# Patient Record
Sex: Female | Born: 1935 | Race: White | Hispanic: No | Smoking: Never smoker
Health system: Southern US, Community
[De-identification: ages and names within clinical notes are randomized; demographics above are authoritative.]

## PROBLEM LIST (undated history)

## (undated) DIAGNOSIS — F419 Anxiety disorder, unspecified: Secondary | ICD-10-CM

## (undated) DIAGNOSIS — E785 Hyperlipidemia, unspecified: Secondary | ICD-10-CM

## (undated) DIAGNOSIS — K625 Hemorrhage of anus and rectum: Secondary | ICD-10-CM

## (undated) DIAGNOSIS — R55 Syncope and collapse: Secondary | ICD-10-CM

## (undated) DIAGNOSIS — I350 Nonrheumatic aortic (valve) stenosis: Secondary | ICD-10-CM

## (undated) DIAGNOSIS — R011 Cardiac murmur, unspecified: Secondary | ICD-10-CM

## (undated) DIAGNOSIS — T7840XA Allergy, unspecified, initial encounter: Secondary | ICD-10-CM

## (undated) DIAGNOSIS — R0600 Dyspnea, unspecified: Secondary | ICD-10-CM

## (undated) DIAGNOSIS — I10 Essential (primary) hypertension: Secondary | ICD-10-CM

## (undated) DIAGNOSIS — R0989 Other specified symptoms and signs involving the circulatory and respiratory systems: Secondary | ICD-10-CM

## (undated) DIAGNOSIS — M1990 Unspecified osteoarthritis, unspecified site: Secondary | ICD-10-CM

## (undated) HISTORY — DX: Hyperlipidemia, unspecified: E78.5

## (undated) HISTORY — DX: Other specified symptoms and signs involving the circulatory and respiratory systems: R09.89

## (undated) HISTORY — PX: CARDIAC VALVE REPLACEMENT: SHX585

## (undated) HISTORY — DX: Unspecified osteoarthritis, unspecified site: M19.90

## (undated) HISTORY — DX: Allergy, unspecified, initial encounter: T78.40XA

## (undated) HISTORY — DX: Essential (primary) hypertension: I10

## (undated) HISTORY — DX: Syncope and collapse: R55

## (undated) HISTORY — DX: Cardiac murmur, unspecified: R01.1

---

## 1995-06-08 HISTORY — PX: BREAST BIOPSY: SHX20

## 1998-07-25 ENCOUNTER — Ambulatory Visit (HOSPITAL_COMMUNITY): Admission: RE | Admit: 1998-07-25 | Discharge: 1998-07-25 | Payer: Self-pay | Admitting: Obstetrics & Gynecology

## 2004-06-07 HISTORY — PX: COLONOSCOPY: SHX174

## 2004-10-07 ENCOUNTER — Other Ambulatory Visit: Payer: Self-pay

## 2004-10-07 ENCOUNTER — Inpatient Hospital Stay: Payer: Self-pay | Admitting: Endocrinology

## 2005-04-07 ENCOUNTER — Ambulatory Visit: Payer: Self-pay | Admitting: General Surgery

## 2005-05-17 ENCOUNTER — Ambulatory Visit: Payer: Self-pay | Admitting: General Surgery

## 2006-04-12 ENCOUNTER — Ambulatory Visit: Payer: Self-pay | Admitting: General Surgery

## 2006-04-15 ENCOUNTER — Ambulatory Visit: Payer: Self-pay | Admitting: General Surgery

## 2007-05-09 ENCOUNTER — Ambulatory Visit: Payer: Self-pay | Admitting: General Surgery

## 2008-05-09 ENCOUNTER — Ambulatory Visit: Payer: Self-pay | Admitting: General Surgery

## 2009-06-19 ENCOUNTER — Ambulatory Visit: Payer: Self-pay | Admitting: General Surgery

## 2009-12-29 ENCOUNTER — Inpatient Hospital Stay: Payer: Self-pay | Admitting: Endocrinology

## 2010-06-07 DIAGNOSIS — R55 Syncope and collapse: Secondary | ICD-10-CM

## 2010-06-07 HISTORY — DX: Syncope and collapse: R55

## 2010-06-22 ENCOUNTER — Ambulatory Visit: Payer: Self-pay | Admitting: General Surgery

## 2011-06-24 ENCOUNTER — Ambulatory Visit: Payer: Self-pay | Admitting: General Surgery

## 2011-08-25 ENCOUNTER — Emergency Department: Payer: Self-pay | Admitting: Emergency Medicine

## 2011-08-25 LAB — COMPREHENSIVE METABOLIC PANEL
Anion Gap: 6 — ABNORMAL LOW (ref 7–16)
Calcium, Total: 8.8 mg/dL (ref 8.5–10.1)
Chloride: 98 mmol/L (ref 98–107)
EGFR (African American): >60
Glucose: 110 mg/dL — ABNORMAL HIGH (ref 65–99)
Osmolality: 263 (ref 275–301)
Potassium: 3.2 mmol/L — ABNORMAL LOW (ref 3.5–5.1)
SGOT(AST): 15 U/L (ref 15–37)
Total Protein: 7.6 g/dL (ref 6.4–8.2)

## 2011-08-25 LAB — URINALYSIS, COMPLETE
Bacteria: NONE SEEN
Glucose,UR: NEGATIVE mg/dL (ref 0–75)
Protein: NEGATIVE
RBC,UR: 1 /HPF (ref 0–5)
WBC UR: 3 /HPF (ref 0–5)

## 2011-08-25 LAB — CBC
MCH: 31.8 pg (ref 26.0–34.0)
MCHC: 34.1 g/dL (ref 32.0–36.0)
Platelet: 232 10*3/uL (ref 150–440)
RDW: 12.7 % (ref 11.5–14.5)
WBC: 4.7 10*3/uL (ref 3.6–11.0)

## 2011-09-02 ENCOUNTER — Ambulatory Visit: Payer: Self-pay

## 2011-09-02 LAB — URINALYSIS, COMPLETE
Bacteria: NEGATIVE
Bilirubin,UR: NEGATIVE
Blood: NEGATIVE
Glucose,UR: NEGATIVE mg/dL (ref 0–75)
Ketone: NEGATIVE
Leukocyte Esterase: NEGATIVE
Nitrite: NEGATIVE
Ph: 6.5 (ref 4.5–8.0)
Protein: NEGATIVE
RBC,UR: NONE SEEN /HPF (ref 0–5)

## 2011-09-03 ENCOUNTER — Ambulatory Visit: Payer: Self-pay | Admitting: Internal Medicine

## 2012-06-07 HISTORY — PX: CATARACT EXTRACTION: SUR2

## 2012-06-26 ENCOUNTER — Ambulatory Visit: Payer: Self-pay | Admitting: General Surgery

## 2012-07-24 ENCOUNTER — Ambulatory Visit: Payer: Self-pay | Admitting: Ophthalmology

## 2012-07-31 ENCOUNTER — Ambulatory Visit: Payer: Self-pay | Admitting: Ophthalmology

## 2012-12-05 ENCOUNTER — Encounter: Payer: Self-pay | Admitting: *Deleted

## 2012-12-29 ENCOUNTER — Encounter: Payer: Self-pay | Admitting: General Surgery

## 2012-12-29 ENCOUNTER — Telehealth: Payer: Self-pay | Admitting: General Surgery

## 2012-12-29 NOTE — Telephone Encounter (Signed)
PATIENT CALLED TODAY TO SCHEDULE CAROTID U/S AT [facility].SHE WAS LAST SEEN IN 07-03-12. HAD BUT OFF DUE TO CATARACT SURGERY IN February 2014. CAROLYN SCHEDULED IT FOR 01-04-13@ 2:00. WOULD YOU LIKE TO SEE HER AFTERWARDS?

## 2013-01-04 ENCOUNTER — Ambulatory Visit: Payer: Self-pay | Admitting: General Surgery

## 2013-01-05 ENCOUNTER — Encounter: Payer: Self-pay | Admitting: General Surgery

## 2013-04-24 ENCOUNTER — Ambulatory Visit: Payer: Self-pay | Admitting: Ophthalmology

## 2013-06-27 ENCOUNTER — Ambulatory Visit: Payer: Self-pay | Admitting: General Surgery

## 2013-06-28 ENCOUNTER — Encounter: Payer: Self-pay | Admitting: General Surgery

## 2013-07-04 ENCOUNTER — Ambulatory Visit (INDEPENDENT_AMBULATORY_CARE_PROVIDER_SITE_OTHER): Payer: Medicare Other | Admitting: General Surgery

## 2013-07-04 ENCOUNTER — Encounter: Payer: Self-pay | Admitting: General Surgery

## 2013-07-04 VITALS — BP 124/82 | HR 64 | Resp 12 | Ht 65.0 in | Wt 137.0 lb

## 2013-07-04 DIAGNOSIS — Z1239 Encounter for other screening for malignant neoplasm of breast: Secondary | ICD-10-CM | POA: Insufficient documentation

## 2013-07-04 NOTE — Patient Instructions (Signed)
Patient to return in 1 year with bilateral screening mammogram. Continue self breast checks. Patient to call with any questions or concerns.

## 2013-07-04 NOTE — Progress Notes (Signed)
Patient ID: Sheila Osborne, female   DOB: 1935/12/16, 78 y.o.   MRN: [id number]  Chief Complaint  Patient presents with  . Follow-up    mammogram    HPI Sheila Osborne is a 78 y.o. female who presents for her follow up breast evaluation. The most recent mammogram was done on 06/27/13. Patient does perform regular self breast checks and gets regular mammograms done. The patient denies any new problems with the breasts at this time.   HPI  Past Medical History  Diagnosis Date  . Hypertension   . Syncope 2012  . Carotid bruit     Past Surgical History  Procedure Laterality Date  . Colonoscopy  2006  . Breast biopsy Left 1997  . Cataract extraction  2014    x2    Family History  Problem Relation Age of Onset  . Cancer Father     stomach    Social History History  Substance Use Topics  . Smoking status: Never Smoker   . Smokeless tobacco: Never Used  . Alcohol Use: Yes     Comment: 1/day    No Known Allergies  Current Outpatient Prescriptions  Medication Sig Dispense Refill  . alendronate (FOSAMAX) 70 MG tablet Take 70 mg by mouth once a week.       Marland Kitchen amLODipine (NORVASC) 5 MG tablet Take 5 mg by mouth daily.      Marland Kitchen aspirin 325 MG tablet Take 325 mg by mouth daily.      Marland Kitchen BENICAR 40 MG tablet Take 40 mg by mouth daily.       . clorazepate (TRANXENE) 3.75 MG tablet Take 3.75 mg by mouth 2 (two) times daily as needed for anxiety.      . Ergocalciferol (VITAMIN D2 PO) Take 1.25 mg by mouth once a week.      . vitamin C (ASCORBIC ACID) 500 MG tablet Take 500 mg by mouth daily.       No current facility-administered medications for this visit.    Review of Systems Review of Systems  Constitutional: Negative.   Respiratory: Negative.   Cardiovascular: Negative.     Blood pressure 124/82, pulse 64, resp. rate 12, height 5\' 5"  (1.651 m), weight 137 lb (62.143 kg).  Physical Exam Physical Exam  Constitutional: She is oriented to person, place, and time. She  appears well-developed and well-nourished.  Neck: Neck supple. No thyromegaly present.  Cardiovascular: Normal rate and regular rhythm.   Murmur heard.  Systolic murmur is present with a grade of 3/6  Pulmonary/Chest: Effort normal and breath sounds normal. Right breast exhibits no inverted nipple, no mass, no nipple discharge, no skin change and no tenderness. Left breast exhibits no inverted nipple, no mass, no nipple discharge, no skin change and no tenderness.  Lymphadenopathy:    She has no cervical adenopathy.    She has no axillary adenopathy.  Neurological: She is alert and oriented to person, place, and time.  Skin: Skin is warm and dry.    Data Reviewed Bilateral screening mammograms dated June 27, 2013 were reviewed and compared to previous studies. No interval change. BI-RAD-1.  Assessment    Benign breast exam.     Plan    The patient desires to continue annual screenings answers office. We'll arrange for a follow up examination in one year.        Robert Bellow 07/05/2013, 6:50 PM

## 2014-03-22 ENCOUNTER — Other Ambulatory Visit: Payer: Self-pay

## 2014-04-08 ENCOUNTER — Encounter: Payer: Self-pay | Admitting: General Surgery

## 2014-07-02 ENCOUNTER — Ambulatory Visit: Payer: Self-pay | Admitting: General Surgery

## 2014-07-03 ENCOUNTER — Encounter: Payer: Self-pay | Admitting: General Surgery

## 2014-07-04 ENCOUNTER — Ambulatory Visit (INDEPENDENT_AMBULATORY_CARE_PROVIDER_SITE_OTHER): Payer: Medicare Other | Admitting: General Surgery

## 2014-07-04 ENCOUNTER — Encounter: Payer: Self-pay | Admitting: General Surgery

## 2014-07-04 VITALS — BP 128/80 | HR 88 | Resp 14 | Ht 65.0 in | Wt 137.0 lb

## 2014-07-04 DIAGNOSIS — Z1239 Encounter for other screening for malignant neoplasm of breast: Secondary | ICD-10-CM

## 2014-07-04 NOTE — Progress Notes (Signed)
Patient ID: Sheila Osborne, female   DOB: 09-Oct-1935, 79 y.o.   MRN: [id number]  Chief Complaint  Patient presents with  . Follow-up    mammogram     HPI Sheila Osborne is a 79 y.o. female who presents for a breast evaluation. The most recent mammogram was done on 07/02/14. Patient does perform regular self breast checks and gets regular mammograms done. The patient denies any new problems with the breasts at this time.   The patient reports that when she walks uphill from the [facility] to her home she does develop a little shortness of breath that might be a little more pronounced than years past. She denied any dramatic change. This may in part be attributed to her decreased general activity level and winter, not doing is much aerobic exercise as she doesn't summer.   HPI  Past Medical History  Diagnosis Date  . Hypertension   . Syncope 2012  . Carotid bruit     Past Surgical History  Procedure Laterality Date  . Colonoscopy  2006  . Breast biopsy Left 1997  . Cataract extraction  2014    x2    Family History  Problem Relation Age of Onset  . Cancer Father     stomach    Social History History  Substance Use Topics  . Smoking status: Never Smoker   . Smokeless tobacco: Never Used  . Alcohol Use: 0.0 oz/week    0 Not specified per week     Comment: 1/day    No Known Allergies  Current Outpatient Prescriptions  Medication Sig Dispense Refill  . alendronate (FOSAMAX) 70 MG tablet Take 70 mg by mouth once a week.     Marland Kitchen amLODipine (NORVASC) 5 MG tablet Take 5 mg by mouth daily.    Marland Kitchen aspirin 325 MG tablet Take 325 mg by mouth daily.    Marland Kitchen BENICAR 40 MG tablet Take 40 mg by mouth daily.     . clorazepate (TRANXENE) 3.75 MG tablet Take 3.75 mg by mouth 2 (two) times daily as needed for anxiety.    . Ergocalciferol (VITAMIN D2 PO) Take 1.25 mg by mouth once a week.    . vitamin C (ASCORBIC ACID) 500 MG tablet Take 500 mg by mouth daily.     No current  facility-administered medications for this visit.    Review of Systems Review of Systems  Constitutional: Negative.   Respiratory: Negative.   Cardiovascular: Negative.     Blood pressure 128/80, pulse 88, resp. rate 14, height 5\' 5"  (1.651 m), weight 137 lb (62.143 kg).  Physical Exam Physical Exam  Constitutional: She is oriented to person, place, and time. She appears well-developed and well-nourished.  Neck: Neck supple.  Cardiovascular: Normal rate and regular rhythm.   Murmur heard.  Systolic murmur is present with a grade of 2/6  Pulmonary/Chest: Effort normal and breath sounds normal. Right breast exhibits no inverted nipple, no mass, no nipple discharge, no skin change and no tenderness. Left breast exhibits no inverted nipple, no mass, no nipple discharge, no skin change and no tenderness.  Thickening upper outer quadrant bilateral breast.  Lymphadenopathy:    She has no cervical adenopathy.    She has no axillary adenopathy.  Neurological: She is alert and oriented to person, place, and time.  Skin: Skin is warm and dry.    Data Reviewed Screening mammograms completed with 3-D tomo synthesis by radiology at [facility] on 07/02/2014 showed no interval change. BI-RADS-1.  Films were compared back to 2014. No interval change. A significant volume of breast parenchyma remains in the upper-outer quadrant bilaterally.  Assessment    Benign breast exam.  10 year follow-up colonoscopy due in fall 2016.  Minimal change in exercise tolerance.    Plan    The patient had questions about the advisability of ongoing screening mammography considering her "age". While 78, her activity level is that of someone in their 52s. In spite of the new screening recommendations, and in light of her dense breast that would continue annual screening mammograms at this time.    Patient will be asked to return to the office in one year with a bilateral screening mammogram. Discuss colonoscopy  next year.  Continue self breast exams. Call office for any new breast issues or concerns.  PCP:  Teodoro Kil 07/04/2014, 12:14 PM

## 2014-07-04 NOTE — Patient Instructions (Addendum)
Patient will be asked to return to the office in one year with a bilateral screening mammogram. Continue self breast exams. Call office for any new breast issues or concerns. Colonoscopy was 2006 discuss colonoscopy at next visit.

## 2014-09-27 NOTE — Op Note (Signed)
PATIENT NAME:  Sheila Osborne, Sheila Osborne MR#:  [id number] DATE OF BIRTH:  04-02-1936  DATE OF PROCEDURE:  04/24/2013  LOCATION: [facility].   PREOPERATIVE DIAGNOSIS: Visually significant cataract of the right eye.   POSTOPERATIVE DIAGNOSIS: Visually significant cataract of the right eye.   OPERATIVE PROCEDURE: Cataract extraction by phacoemulsification with implant of intraocular lens to right eye.   SURGEON: Birder Robson, MD.   ANESTHESIA:  1. Managed anesthesia care.  2. Topical tetracaine drops followed by 2% Xylocaine jelly applied in the preoperative holding area.   COMPLICATIONS: None.   TECHNIQUE:  Stop and chop.  DESCRIPTION OF PROCEDURE: The patient was examined and consented in the preoperative holding area where the aforementioned topical anesthesia was applied to the right eye and then brought back to the Operating Room where the right eye was prepped and draped in the usual sterile ophthalmic fashion and a lid speculum was placed. A paracentesis was created with the side port blade and the anterior chamber was filled with viscoelastic. A near clear corneal incision was performed with the steel keratome. A continuous curvilinear capsulorrhexis was performed with a cystotome followed by the capsulorrhexis forceps. Hydrodissection and hydrodelineation were carried out with BSS on a blunt cannula. The lens was removed in a stop and chop technique and the remaining cortical material was removed with the irrigation-aspiration handpiece. The capsular bag was inflated with viscoelastic and the Tecnis ZCB00 20.0 -diopter lens, serial number [id number] was placed in the capsular bag without complication. The remaining viscoelastic was removed from the eye with the irrigation-aspiration handpiece. The wounds were hydrated. The anterior chamber was flushed with Miostat and the eye was inflated to physiologic pressure. 0.1 mL of cefuroxime concentration 10 mg/mL was placed in  the anterior chamber. The wounds were found to be water tight. The eye was dressed with Vigamox. The patient was given protective glasses to wear throughout the day and a shield with which to sleep tonight. The patient was also given drops with which to begin a drop regimen today and will follow-up with me in one day.      ____________________________ Livingston Diones. Takeyah Wieman, MD wlp:cc D: 04/24/2013 20:38:57 ET T: 04/24/2013 21:25:59 ET JOB#: [id number]  cc: Raylynne Cubbage L. Cagney Steenson, MD, <Dictator> Livingston Diones Chayna Surratt MD ELECTRONICALLY SIGNED 04/25/2013 13:28

## 2014-09-27 NOTE — Op Note (Signed)
PATIENT NAME:  Sheila Osborne, Sheila Osborne MR#:  [id number] DATE OF BIRTH:  07-30-1935  DATE OF PROCEDURE:  07/31/2012  PREOPERATIVE DIAGNOSIS: Visually significant cataract of the left eye.   POSTOPERATIVE DIAGNOSIS: Visually significant cataract of the left eye.   OPERATIVE PROCEDURE: Cataract extraction by phacoemulsification with implant of intraocular lens to left eye.   SURGEON: Birder Robson, MD.   ANESTHESIA:  1. Managed anesthesia care.  2. Topical tetracaine drops followed by 2% Xylocaine jelly applied in the preoperative holding area.   COMPLICATIONS: None.   TECHNIQUE:  Stop and chop.  DESCRIPTION OF PROCEDURE: The patient was examined and consented in the preoperative holding area where the aforementioned topical anesthesia was applied to the left eye and then brought back to the Operating Room where the left eye was prepped and draped in the usual sterile ophthalmic fashion and a lid speculum was placed. A paracentesis was created with the side port blade and the anterior chamber was filled with viscoelastic. A near clear corneal incision was performed with the steel keratome. A continuous curvilinear capsulorrhexis was performed with a cystotome followed by the capsulorrhexis forceps. Hydrodissection and hydrodelineation were carried out with BSS on a blunt cannula. The lens was removed in a stop and chop technique and the remaining cortical material was removed with the irrigation-aspiration handpiece. The capsular bag was inflated with viscoelastic and the Tecnis ZCB00 19.0-diopter lens, serial number [id number] was placed in the capsular bag without complication. The remaining viscoelastic was removed from the eye with the irrigation-aspiration handpiece. The wounds were hydrated. The anterior chamber was flushed with Miostat and the eye was inflated to physiologic pressure. 0.1 mL of cefuroxime concentration 10 mg/mL was placed in the anterior chamber. The wounds were found to be water  tight. The eye was dressed with Vigamox. The patient was given protective glasses to wear throughout the day and a shield with which to sleep tonight. The patient was also given drops with which to begin a drop regimen today and will follow-up with me in one day.  ____________________________ Livingston Diones. Krayton Wortley, MD wlp:sb D: 07/31/2012 11:42:22 ET T: 07/31/2012 13:04:12 ET JOB#: [id number]  cc: Wynter Isaacs L. Britanee Vanblarcom, MD, <Dictator> Livingston Diones Latesha Chesney MD ELECTRONICALLY SIGNED 08/18/2012 17:09

## 2014-12-02 ENCOUNTER — Other Ambulatory Visit: Payer: Self-pay

## 2015-04-15 ENCOUNTER — Other Ambulatory Visit: Payer: Self-pay | Admitting: Endocrinology

## 2015-04-15 DIAGNOSIS — I471 Supraventricular tachycardia: Secondary | ICD-10-CM

## 2015-04-21 ENCOUNTER — Ambulatory Visit
Admission: RE | Admit: 2015-04-21 | Discharge: 2015-04-21 | Disposition: A | Discharge status: Home / Self Care | Payer: Medicare Other | Source: Ambulatory Visit | Attending: Endocrinology | Admitting: Endocrinology

## 2015-04-21 DIAGNOSIS — I471 Supraventricular tachycardia: Secondary | ICD-10-CM | POA: Insufficient documentation

## 2015-04-21 DIAGNOSIS — I351 Nonrheumatic aortic (valve) insufficiency: Secondary | ICD-10-CM | POA: Insufficient documentation

## 2015-04-21 DIAGNOSIS — I340 Nonrheumatic mitral (valve) insufficiency: Secondary | ICD-10-CM | POA: Diagnosis not present

## 2015-04-21 NOTE — Progress Notes (Signed)
*  PRELIMINARY RESULTS* Echocardiogram 2D Echocardiogram has been performed.  Sheila Osborne 04/21/2015, 10:38 AM

## 2015-04-24 ENCOUNTER — Other Ambulatory Visit: Payer: Self-pay | Admitting: *Deleted

## 2015-04-24 DIAGNOSIS — Z1231 Encounter for screening mammogram for malignant neoplasm of breast: Secondary | ICD-10-CM

## 2015-07-04 ENCOUNTER — Other Ambulatory Visit: Payer: Self-pay | Admitting: General Surgery

## 2015-07-04 ENCOUNTER — Ambulatory Visit
Admission: RE | Admit: 2015-07-04 | Discharge: 2015-07-04 | Disposition: A | Discharge status: Home / Self Care | Payer: Medicare Other | Source: Ambulatory Visit | Attending: General Surgery | Admitting: General Surgery

## 2015-07-04 DIAGNOSIS — Z1231 Encounter for screening mammogram for malignant neoplasm of breast: Secondary | ICD-10-CM

## 2015-07-10 ENCOUNTER — Ambulatory Visit (INDEPENDENT_AMBULATORY_CARE_PROVIDER_SITE_OTHER): Payer: Medicare Other | Admitting: General Surgery

## 2015-07-10 ENCOUNTER — Encounter: Payer: Self-pay | Admitting: General Surgery

## 2015-07-10 VITALS — BP 124/66 | HR 76 | Resp 12 | Ht 64.0 in | Wt 137.0 lb

## 2015-07-10 DIAGNOSIS — R55 Syncope and collapse: Secondary | ICD-10-CM | POA: Diagnosis not present

## 2015-07-10 DIAGNOSIS — Z1211 Encounter for screening for malignant neoplasm of colon: Secondary | ICD-10-CM

## 2015-07-10 DIAGNOSIS — I350 Nonrheumatic aortic (valve) stenosis: Secondary | ICD-10-CM | POA: Diagnosis not present

## 2015-07-10 DIAGNOSIS — Z1239 Encounter for other screening for malignant neoplasm of breast: Secondary | ICD-10-CM

## 2015-07-10 NOTE — Progress Notes (Signed)
Patient ID: Sheila Osborne, female   DOB: 07/15/1935, 80 y.o.   MRN: [id number]  Chief Complaint  Patient presents with  . Follow-up    mammogram and screening colonoscopy    HPI Sheila Osborne is a 80 y.o. female who presents for her annual breast evaluation. The most recent mammogram was done on 07/04/15 area the patient is unaware of any changes in her breasts.  Patient does perform regular self breast checks and gets regular mammograms done.  Patient also want to discuss colonoscopy. Last colonoscopy 2006. No GI problems.   Patient states she pass out while hiking in the [facility] in November 2016. She has been evaluated by her primary care physician, Dr. Francoise Schaumann and told that she had some progression of her previously identified aortic stenosis. By reports she's had an echocardiogram. She has had spells where she will become very weak, although shortness of breath is not a strong component. She has had no further episodes where she passed out. She has had a cancel social engagements because of decreased exercise tolerance.  The patient had previously been evaluated by Bartholome Bill, M.D. from cardiology in 2011 when she was admitted to [facility] with a syncopal spell. Review of the cardiology records from that visit showed that she had had several episodes of syncope over the preceding 10 years. The patient reports the episodes have become more frequent.  The patient's husband died of aortic valvular disease, possibly during surgical repair and she was quite adamant that she would never consider open heart surgery. HPI  Past Medical History  Diagnosis Date  . Hypertension   . Syncope 2012  . Carotid bruit     Past Surgical History  Procedure Laterality Date  . Colonoscopy  2006  . Cataract extraction  2014    x2  . Breast biopsy Left 1997    core- neg    Family History  Problem Relation Age of Onset  . Cancer Father     stomach  . Breast cancer  Neg Hx     Social History Social History  Substance Use Topics  . Smoking status: Never Smoker   . Smokeless tobacco: Never Used  . Alcohol Use: 0.0 oz/week    0 Standard drinks or equivalent per week     Comment: 1/day    No Known Allergies  Current Outpatient Prescriptions  Medication Sig Dispense Refill  . alendronate (FOSAMAX) 70 MG tablet Take 70 mg by mouth once a week.     Marland Kitchen amLODipine (NORVASC) 5 MG tablet Take 5 mg by mouth daily.    Marland Kitchen aspirin 325 MG tablet Take 325 mg by mouth daily.    Marland Kitchen BENICAR 40 MG tablet Take 40 mg by mouth daily.     . clorazepate (TRANXENE) 3.75 MG tablet Take 3.75 mg by mouth 2 (two) times daily as needed for anxiety.    . Ergocalciferol (VITAMIN D2 PO) Take 1.25 mg by mouth once a week.    . vitamin C (ASCORBIC ACID) 500 MG tablet Take 500 mg by mouth daily.     No current facility-administered medications for this visit.    Review of Systems Review of Systems  Constitutional: Negative.   Respiratory: Positive for shortness of breath.   Cardiovascular: Negative.     Blood pressure 124/66, pulse 76, resp. rate 12, height 5\' 4"  (1.626 m), weight 137 lb (62.143 kg).  Physical Exam Physical Exam  Constitutional: She is oriented to person, place, and time.  She appears well-developed and well-nourished.  Eyes: Conjunctivae are normal. No scleral icterus.  Neck: Neck supple.  Cardiovascular: Normal rate and regular rhythm.   Murmur heard.  Systolic murmur is present with a grade of 4/6  Pulses:      Carotid pulses are 2+ on the right side with bruit, and 2+ on the left side with bruit.   Profound bruit over the aortic outflow tract extending into the subclavian vessels bilaterally as well as into both carotids.  Pulmonary/Chest: Effort normal and breath sounds normal. Right breast exhibits no inverted nipple, no mass, no nipple discharge, no skin change and no tenderness. Left breast exhibits no inverted nipple, no mass, no nipple  discharge, no skin change and no tenderness.  Abdominal: Soft. Bowel sounds are normal. There is no tenderness.  Lymphadenopathy:    She has no cervical adenopathy.    She has no axillary adenopathy.  Neurological: She is alert and oriented to person, place, and time.  Skin: Skin is warm.    Data Reviewed Bilateral screening mammograms dated 07/04/2015 were reviewed. Prominent breast tissue for age but otherwise unremarkable. BI-RADS-1. 2011 hospitalization records.    Assessment    Benign breast exam.  Accelerating frequency of syncopal/acute fatigue possibly related to aortic valvular disease.     Plan    As the patient had previously been evaluated by Dr. Ubaldo Glassing, and with the fact that she rarely is one to complain, I thought it appropriate that she undergo repeat cardiac evaluation. She was amenable to having reassessment completed by Dr. Ubaldo Glassing.      Patient has been scheduled for an appointment to see Dr. Ubaldo Glassing at [facility] for Monday, 07-14-15 at 10 am (arrive 9:45 am) for symptomatic aortic stenosis.   Once the patient's cardiac status is clear, she will be a candidate for a screening colonoscopy.  Colonoscopy with possible biopsy/polypectomy prn: Information regarding the procedure, including its potential risks and complications (including but not limited to perforation of the bowel, which may require emergency surgery to repair, and bleeding) was verbally given to the patient. Educational information regarding lower intestinal endoscopy was given to the patient. Written instructions for how to complete the bowel prep using Miralax were provided. The importance of drinking ample fluids to avoid dehydration as a result of the prep emphasized.  Patient will be contacted to schedule a colonoscopy after upcoming appointment with Dr. Ubaldo Glassing. This patient was provided with colonoscopy instructions. Miralax prescription will be sent in once date arranged. This patient will need to  decrease 325 mg aspirin to 81 mg aspirin one week prior to colonoscopy. Patient will not require a pre-op visit prior to colonoscopy. History and physical will be updated the morning of procedure.   Patient will be asked to return to the office in one year with a bilateral screening mammogram.  PCP:  [place] This information has been scribed by Gaspar Cola CMA.    Robert Bellow 07/11/2015, 7:58 AM

## 2015-07-10 NOTE — Patient Instructions (Addendum)
Colonoscopy A colonoscopy is an exam to look at the entire large intestine (colon). This exam can help find problems such as tumors, polyps, inflammation, and areas of bleeding. The exam takes about 1 hour.  LET [facility] CARE PROVIDER KNOW ABOUT:   Any allergies you have.  All medicines you are taking, including vitamins, herbs, eye drops, creams, and over-the-counter medicines.  Previous problems you or members of your family have had with the use of anesthetics.  Any blood disorders you have.  Previous surgeries you have had.  Medical conditions you have. RISKS AND COMPLICATIONS  Generally, this is a safe procedure. However, as with any procedure, complications can occur. Possible complications include:  Bleeding.  Tearing or rupture of the colon wall.  Reaction to medicines given during the exam.  Infection (rare). BEFORE THE PROCEDURE   Ask your health care provider about changing or stopping your regular medicines.  You may be prescribed an oral bowel prep. This involves drinking a large amount of medicated liquid, starting the day before your procedure. The liquid will cause you to have multiple loose stools until your stool is almost clear or light green. This cleans out your colon in preparation for the procedure.  Do not eat or drink anything else once you have started the bowel prep, unless your health care provider tells you it is safe to do so.  Arrange for someone to drive you home after the procedure. PROCEDURE   You will be given medicine to help you relax (sedative).  You will lie on your side with your knees bent.  A long, flexible tube with a light and camera on the end (colonoscope) will be inserted through the rectum and into the colon. The camera sends video back to a computer screen as it moves through the colon. The colonoscope also releases carbon dioxide gas to inflate the colon. This helps your health care provider see the area better.  During  the exam, your health care provider may take a small tissue sample (biopsy) to be examined under a microscope if any abnormalities are found.  The exam is finished when the entire colon has been viewed. AFTER THE PROCEDURE   Do not drive for 24 hours after the exam.  You may have a small amount of blood in your stool.  You may pass moderate amounts of gas and have mild abdominal cramping or bloating. This is caused by the gas used to inflate your colon during the exam.  Ask when your test results will be ready and how you will get your results. Make sure you get your test results.   This information is not intended to replace advice given to you by your health care provider. Make sure you discuss any questions you have with your health care provider.   Document Released: 05/21/2000 Document Revised: 03/14/2013 Document Reviewed: 01/29/2013 Elsevier Interactive Patient Education [employer].  Patient has been scheduled for an appointment to see Dr. Ubaldo Glassing at [facility] for Monday, 07-14-15 at 10 am (arrive 9:45 am).   Patient will be asked to return to the office in one year with a bilateral screening mammogram.

## 2015-07-11 DIAGNOSIS — R55 Syncope and collapse: Secondary | ICD-10-CM | POA: Insufficient documentation

## 2015-07-11 DIAGNOSIS — Z1211 Encounter for screening for malignant neoplasm of colon: Secondary | ICD-10-CM | POA: Insufficient documentation

## 2015-07-11 DIAGNOSIS — I350 Nonrheumatic aortic (valve) stenosis: Secondary | ICD-10-CM | POA: Insufficient documentation

## 2015-12-18 ENCOUNTER — Telehealth: Payer: Self-pay | Admitting: *Deleted

## 2015-12-18 NOTE — Telephone Encounter (Signed)
Patient contacted today to schedule colonoscopy for September 2017.   The patient states that she wants to wait until November 2017 to have this completed. Patient will be contacted once schedule is available to arrange a date.   Patient has been asked to decrease current 325 mg aspirin to 81 mg aspirin starting one week prior to procedure. Miralax prescription will be sent in to patient's pharmacy once date arranged.

## 2015-12-20 ENCOUNTER — Encounter: Payer: Self-pay | Admitting: Emergency Medicine

## 2015-12-20 ENCOUNTER — Emergency Department
Admission: EM | Admit: 2015-12-20 | Discharge: 2015-12-20 | Disposition: A | Discharge status: Home / Self Care | Payer: Medicare Other | Attending: Emergency Medicine | Admitting: Emergency Medicine

## 2015-12-20 DIAGNOSIS — H579 Unspecified disorder of eye and adnexa: Secondary | ICD-10-CM | POA: Diagnosis present

## 2015-12-20 DIAGNOSIS — H029 Unspecified disorder of eyelid: Secondary | ICD-10-CM | POA: Insufficient documentation

## 2015-12-20 DIAGNOSIS — Z7982 Long term (current) use of aspirin: Secondary | ICD-10-CM | POA: Diagnosis not present

## 2015-12-20 DIAGNOSIS — Z79899 Other long term (current) drug therapy: Secondary | ICD-10-CM | POA: Insufficient documentation

## 2015-12-20 DIAGNOSIS — I10 Essential (primary) hypertension: Secondary | ICD-10-CM | POA: Insufficient documentation

## 2015-12-20 MED ORDER — KETOROLAC TROMETHAMINE 0.5 % OP SOLN: 1.0000 [drp] | Freq: Four times a day (QID) | OPHTHALMIC | Status: DC

## 2015-12-20 NOTE — ED Provider Notes (Signed)
[facility] Emergency Department Provider Note  ____________________________________________  Time seen: Approximately 5:17 PM  I have reviewed the triage vital signs and the nursing notes.   HISTORY  Chief Complaint Eye Problem    HPI Sheila Osborne is a 80 y.o. female who presents to emergency department complaining of a small "blister" to the medial aspect of the upper eyelid over the right eye. Patient states that she first noted a slight itching/irritation feeling in her eye. She looked at her eye and the mere and believed that she saw a blister on the eyelid. The patient states that she moved earlier today but states that she wore glasses and does not remember anything coming into contact with her eye. She denies any pain, visual changes, discharge from eye. She denies any tenderness to palpation with rubbing her eyelid.Patient denies any other complaints. Patient does wear eye glasses but does not wear contacts.   Past Medical History  Diagnosis Date  . Hypertension   . Syncope 2012  . Carotid bruit     Patient Active Problem List   Diagnosis Date Noted  . Encounter for screening colonoscopy 07/11/2015  . Faintness 07/11/2015  . Aortic stenosis 07/11/2015  . Breast screening 07/04/2013    Past Surgical History  Procedure Laterality Date  . Colonoscopy  2006  . Cataract extraction  2014    x2  . Breast biopsy Left 1997    core- neg    Current Outpatient Rx  Name  Route  Sig  Dispense  Refill  . alendronate (FOSAMAX) 70 MG tablet   Oral   Take 70 mg by mouth once a week.          Marland Kitchen amLODipine (NORVASC) 5 MG tablet   Oral   Take 5 mg by mouth daily.         Marland Kitchen aspirin 325 MG tablet   Oral   Take 325 mg by mouth daily.         Marland Kitchen BENICAR 40 MG tablet   Oral   Take 40 mg by mouth daily.          . clorazepate (TRANXENE) 3.75 MG tablet   Oral   Take 3.75 mg by mouth 2 (two) times daily as needed for anxiety.          . Ergocalciferol (VITAMIN D2 PO)   Oral   Take 1.25 mg by mouth once a week.         Marland Kitchen ketorolac (ACULAR) 0.5 % ophthalmic solution   Right Eye   Place 1 drop into the right eye 4 (four) times daily.   5 mL   0   . vitamin C (ASCORBIC ACID) 500 MG tablet   Oral   Take 500 mg by mouth daily.           Allergies Review of patient's allergies indicates no known allergies.  Family History  Problem Relation Age of Onset  . Cancer Father     stomach  . Breast cancer Neg Hx     Social History Social History  Substance Use Topics  . Smoking status: Never Smoker   . Smokeless tobacco: Never Used  . Alcohol Use: 0.0 oz/week    0 Standard drinks or equivalent per week     Comment: 1/day     Review of Systems  Constitutional: No fever/chills Eyes: No visual changes. No discharge ENT: No upper respiratory complaints. Cardiovascular: no chest pain. Respiratory: no cough. No SOB.  Skin: Negative for rash, abrasions, lacerations, ecchymosis. Neurological: Negative for headaches, focal weakness or numbness. 10-point ROS otherwise negative.  ____________________________________________   PHYSICAL EXAM:  VITAL SIGNS: ED Triage Vitals  Enc Vitals Group     BP 12/20/15 1548 145/85 mmHg     Pulse Rate 12/20/15 1548 86     Resp 12/20/15 1548 20     Temp 12/20/15 1548 98.7 F (37.1 C)     Temp Source 12/20/15 1548 Oral     SpO2 12/20/15 1548 99 %     Weight 12/20/15 1548 145 lb (65.772 kg)     Height 12/20/15 1548 5\' 4"  (1.626 m)     Head Cir --      Peak Flow --      Pain Score --      Pain Loc --      Pain Edu? --      Excl. in [place]? --      Constitutional: Alert and oriented. Well appearing and in no acute distress. Eyes: Conjunctivae are normal. PERRL. EOMI.Funduscopic exam reveals good red reflex bilaterally. No visible signs of trauma. Small nodule-like appearance to the medial aspect of the upper eyelid. This appears to possibly be the beginnings of  a stye. Head: Atraumatic. ENT:      Ears:       Nose: No congestion/rhinnorhea.      Mouth/Throat: Mucous membranes are moist.  Neck: No stridor.    Cardiovascular: Normal rate, regular rhythm. Normal S1 and S2.  Good peripheral circulation. Respiratory: Normal respiratory effort without tachypnea or retractions. Lungs CTAB. Good air entry to the bases with no decreased or absent breath sounds. Musculoskeletal: Full range of motion to all extremities. No gross deformities appreciated. Neurologic:  Normal speech and language. No gross focal neurologic deficits are appreciated.  Skin:  Skin is warm, dry and intact. No rash noted. Psychiatric: Mood and affect are normal. Speech and behavior are normal. Patient exhibits appropriate insight and judgement.   ____________________________________________   LABS (all labs ordered are listed, but only abnormal results are displayed)  Labs Reviewed - No data to display ____________________________________________  EKG   ____________________________________________  RADIOLOGY   No results found.  ____________________________________________    PROCEDURES  Procedure(s) performed:       Medications - No data to display   ____________________________________________   INITIAL IMPRESSION / ASSESSMENT AND PLAN / ED COURSE  Pertinent labs & imaging results that were available during my care of the patient were reviewed by me and considered in my medical decision making (see chart for details).  Patient's diagnosis is consistent with small eyelid nodule to the medial aspect of the right upper eyelid. This may be the start of a stye. No visual changes. Exam is reassuring.. Patient will be discharged home with prescriptions for Acular for symptom improvement. Patient is also advised to use a warm hot compress to the area.. Patient is to follow up with ophthalmology as needed or otherwise directed. Patient is given ED precautions to  return to the ED for any worsening or new symptoms.     ____________________________________________  FINAL CLINICAL IMPRESSION(S) / ED DIAGNOSES  Final diagnoses:  Eyelid lesion      NEW MEDICATIONS STARTED DURING THIS VISIT:  New Prescriptions   KETOROLAC (ACULAR) 0.5 % OPHTHALMIC SOLUTION    Place 1 drop into the right eye 4 (four) times daily.        This chart was dictated using voice recognition software/Dragon. Despite best  efforts to proofread, errors can occur which can change the meaning. Any change was purely unintentional.    Darletta Moll, PA-C 12/20/15 [place], MD 12/21/15 [phone]

## 2015-12-20 NOTE — ED Notes (Signed)

## 2015-12-20 NOTE — ED Notes (Signed)
Developed a possible blister area to right eye today  Denies any pain

## 2015-12-20 NOTE — Discharge Instructions (Signed)
Stye A stye is a bump on your eyelid caused by a bacterial infection. A stye can form inside the eyelid (internal stye) or outside the eyelid (external stye). An internal stye may be caused by an infected oil-producing gland inside your eyelid. An external stye may be caused by an infection at the base of your eyelash (hair follicle). Styes are very common. Anyone can get them at any age. They usually occur in just one eye, but you may have more than one in either eye.  CAUSES  The infection is almost always caused by bacteria called Staphylococcus aureus. This is a common type of bacteria that lives on your skin. RISK FACTORS You may be at higher risk for a stye if you have had one before. You may also be at higher risk if you have:  Diabetes.  Long-term illness.  Long-term eye redness.  A skin condition called seborrhea.  High fat levels in your blood (lipids). SIGNS AND SYMPTOMS  Eyelid pain is the most common symptom of a stye. Internal styes are more painful than external styes. Other signs and symptoms may include:  Painful swelling of your eyelid.  A scratchy feeling in your eye.  Tearing and redness of your eye.  Pus draining from the stye. DIAGNOSIS  Your health care provider may be able to diagnose a stye just by examining your eye. The health care provider may also check to make sure:  You do not have a fever or other signs of a more serious infection.  The infection has not spread to other parts of your eye or areas around your eye. TREATMENT  Most styes will clear up in a few days without treatment. In some cases, you may need to use antibiotic drops or ointment to prevent infection. Your health care provider may have to drain the stye surgically if your stye is:  Large.  Causing a lot of pain.  Interfering with your vision. This can be done using a thin blade or a needle.  HOME CARE INSTRUCTIONS   Take medicines only as directed by your health care  provider.  Apply a clean, warm compress to your eye for 10 minutes, 4 times a day.  Do not wear contact lenses or eye makeup until your stye has healed.  Do not try to pop or drain the stye. SEEK MEDICAL CARE IF:  You have chills or a fever.  Your stye does not go away after several days.  Your stye affects your vision.  Your eyeball becomes swollen, red, or painful. MAKE SURE YOU:  Understand these instructions.  Will watch your condition.  Will get help right away if you are not doing well or get worse.   This information is not intended to replace advice given to you by your health care provider. Make sure you discuss any questions you have with your health care provider.   Document Released: 03/03/2005 Document Revised: 06/14/2014 Document Reviewed: 09/07/2013 Elsevier Interactive Patient Education 2016 Elsevier Inc.  

## 2015-12-20 NOTE — ED Notes (Addendum)
Pt has a small blister like are on the top of her eye lid and on the corner of lower lid. Denies pain, slight itching. She noticed it after mowing the grass

## 2016-02-10 ENCOUNTER — Other Ambulatory Visit: Payer: Self-pay | Admitting: Cardiology

## 2016-02-13 ENCOUNTER — Encounter
Admission: RE | Disposition: A | Discharge status: Home / Self Care | Payer: Self-pay | Source: Ambulatory Visit | Attending: Cardiology

## 2016-02-13 ENCOUNTER — Ambulatory Visit
Admission: RE | Admit: 2016-02-13 | Discharge: 2016-02-13 | Disposition: A | Discharge status: Home / Self Care | Payer: Medicare Other | Source: Ambulatory Visit | Attending: Cardiology | Admitting: Cardiology

## 2016-02-13 DIAGNOSIS — I350 Nonrheumatic aortic (valve) stenosis: Secondary | ICD-10-CM | POA: Insufficient documentation

## 2016-02-13 DIAGNOSIS — Z7982 Long term (current) use of aspirin: Secondary | ICD-10-CM | POA: Insufficient documentation

## 2016-02-13 DIAGNOSIS — Z8249 Family history of ischemic heart disease and other diseases of the circulatory system: Secondary | ICD-10-CM | POA: Insufficient documentation

## 2016-02-13 DIAGNOSIS — F419 Anxiety disorder, unspecified: Secondary | ICD-10-CM | POA: Diagnosis not present

## 2016-02-13 DIAGNOSIS — Z79899 Other long term (current) drug therapy: Secondary | ICD-10-CM | POA: Insufficient documentation

## 2016-02-13 DIAGNOSIS — I10 Essential (primary) hypertension: Secondary | ICD-10-CM | POA: Diagnosis not present

## 2016-02-13 DIAGNOSIS — R0600 Dyspnea, unspecified: Secondary | ICD-10-CM | POA: Insufficient documentation

## 2016-02-13 DIAGNOSIS — R5383 Other fatigue: Secondary | ICD-10-CM | POA: Insufficient documentation

## 2016-02-13 DIAGNOSIS — R011 Cardiac murmur, unspecified: Secondary | ICD-10-CM | POA: Diagnosis not present

## 2016-02-13 DIAGNOSIS — Z800 Family history of malignant neoplasm of digestive organs: Secondary | ICD-10-CM | POA: Insufficient documentation

## 2016-02-13 DIAGNOSIS — Z9849 Cataract extraction status, unspecified eye: Secondary | ICD-10-CM | POA: Insufficient documentation

## 2016-02-13 DIAGNOSIS — R55 Syncope and collapse: Secondary | ICD-10-CM | POA: Diagnosis not present

## 2016-02-13 DIAGNOSIS — Z823 Family history of stroke: Secondary | ICD-10-CM | POA: Insufficient documentation

## 2016-02-13 HISTORY — PX: CARDIAC CATHETERIZATION: SHX172

## 2016-02-13 SURGERY — RIGHT/LEFT HEART CATH AND CORONARY ANGIOGRAPHY
Anesthesia: Moderate Sedation | Laterality: Bilateral

## 2016-02-13 MED ORDER — SODIUM CHLORIDE 0.9% FLUSH: 3.0000 mL | Freq: Two times a day (BID) | INTRAVENOUS | Status: DC

## 2016-02-13 MED ORDER — SODIUM CHLORIDE 0.9 % WEIGHT BASED INFUSION: 3.0000 mL/kg/h | INTRAVENOUS | Status: DC

## 2016-02-13 MED ORDER — IOPAMIDOL (ISOVUE-300) INJECTION 61%
INTRAVENOUS | Status: DC | PRN
  Administered 2016-02-13: 110 mL via INTRA_ARTERIAL

## 2016-02-13 MED ORDER — SODIUM CHLORIDE 0.9 % WEIGHT BASED INFUSION
1.0000 mL/kg/h | INTRAVENOUS | Status: DC
  Administered 2016-02-13: 1 mL/kg/h via INTRAVENOUS

## 2016-02-13 MED ORDER — SODIUM CHLORIDE 0.9 % IV SOLN: 250.0000 mL | INTRAVENOUS | Status: DC | PRN

## 2016-02-13 MED ORDER — FENTANYL CITRATE (PF) 100 MCG/2ML IJ SOLN
INTRAMUSCULAR | Status: DC | PRN
  Administered 2016-02-13: 25 ug via INTRAVENOUS

## 2016-02-13 MED ORDER — FENTANYL CITRATE (PF) 100 MCG/2ML IJ SOLN
INTRAMUSCULAR | Status: AC
  Filled 2016-02-13: qty 2

## 2016-02-13 MED ORDER — SODIUM CHLORIDE 0.9% FLUSH: 3.0000 mL | INTRAVENOUS | Status: DC | PRN

## 2016-02-13 MED ORDER — MIDAZOLAM HCL 2 MG/2ML IJ SOLN
INTRAMUSCULAR | Status: DC | PRN
  Administered 2016-02-13: 1 mg via INTRAVENOUS

## 2016-02-13 MED ORDER — MIDAZOLAM HCL 2 MG/2ML IJ SOLN
INTRAMUSCULAR | Status: AC
  Filled 2016-02-13: qty 2

## 2016-02-13 MED ORDER — ASPIRIN 81 MG PO CHEW
81.0000 mg | CHEWABLE_TABLET | ORAL | Status: AC
  Administered 2016-02-13: 81 mg via ORAL

## 2016-02-13 MED ORDER — HEPARIN (PORCINE) IN NACL 2-0.9 UNIT/ML-% IJ SOLN
INTRAMUSCULAR | Status: AC
  Filled 2016-02-13: qty 1000

## 2016-02-13 MED ORDER — ASPIRIN 81 MG PO CHEW
CHEWABLE_TABLET | ORAL | Status: AC
  Administered 2016-02-13: 81 mg via ORAL
  Filled 2016-02-13: qty 1

## 2016-02-13 SURGICAL SUPPLY — 17 items
CATH 5FR JL4 DIAGNOSTIC (CATHETERS) ×1 IMPLANT
CATH 5FR JR4 DIAGNOSTIC (CATHETERS) ×2 IMPLANT
CATH INFINITI 5 FR 3DRC (CATHETERS) ×2 IMPLANT
CATH INFINITI 5FR ANG PIGTAIL (CATHETERS) ×2 IMPLANT
CATH INFINITI 5FR JL5 (CATHETERS) ×2 IMPLANT
CATH SWANZ 7F THERMO (CATHETERS) ×2 IMPLANT
DEVICE CLOSURE MYNXGRIP 6/7F (Vascular Products) ×2 IMPLANT
GUIDEWIRE EMER 3M J .025X150CM (WIRE) ×2 IMPLANT
KIT MANI 3VAL PERCEP (MISCELLANEOUS) ×3 IMPLANT
KIT RIGHT HEART (MISCELLANEOUS) ×3 IMPLANT
NDL PERC 18GX7CM (NEEDLE) IMPLANT
NEEDLE PERC 18GX7CM (NEEDLE) ×3 IMPLANT
PACK CARDIAC CATH (CUSTOM PROCEDURE TRAY) ×3 IMPLANT
SHEATH BRITE TIP 6FRX11 (SHEATH) ×2 IMPLANT
SHEATH PINNACLE 7F 10CM (SHEATH) ×2 IMPLANT
WIRE EMERALD 3MM-J .035X150CM (WIRE) ×2 IMPLANT
WIRE EMERALD ST .035X150CM (WIRE) ×2 IMPLANT

## 2016-02-13 NOTE — H&P (Signed)
Chief Complaint: Chief Complaint  Patient presents with  . Follow-up  discuss test results stress echo 01-29-16  Date of Service: 02/04/2016 Date of Birth: Mar 02, 1936 PCP: Lenard Simmer, MD  History of Present Illness: Sheila Osborne is a 80 y.o.female patient who presents for evaluation she has a history of mild to moderate aortic stenosis with a valve area of 1.21 cm by echo in November 2016. She has noted gradual increase in dyspnea on exertion. She complains of chest tightness and fatigue when she ambulates. She denies any syncope. She has noted gradual decline in her ability to function. Recent echocardiogram suggested severe aortic stenosis with a calculated valve area of 0.64 cm. Peak gradient of 67.9 mmHg and a mean gradient of 41 mmHg. Ejection fraction was normal. She had a careful functional study which revealed no reversible ischemia. Functional status has reduced recently. Past Medical and Surgical History  Past Medical History Past Medical History:  Diagnosis Date  . Anxiety  . Heart murmur  moderate as; ava 1.21 cm 2 11/16  . Hypertension  . Syncope and collapse   Past Surgical History She has a past surgical history that includes Cataract extraction.   Medications and Allergies  Current Medications  Current Outpatient Prescriptions  Medication Sig Dispense Refill  . alendronate (FOSAMAX) 70 MG tablet Take 1 tablet by mouth once a week.  Marland Kitchen amLODIPine (NORVASC) 5 MG tablet Take 1 tablet by mouth once daily.  Marland Kitchen ascorbic acid, vitamin C, (VITAMIN C) 500 MG tablet Take 500 mg by mouth.  Marland Kitchen aspirin 325 MG tablet Take 325 mg by mouth.  . ergocalciferol, vitamin D2, 50,000 unit capsule Take 1 capsule by mouth once a week. 1  . LORazepam (ATIVAN) 0.5 MG tablet Take 0.5 mg by mouth nightly as needed for Anxiety.  Marland Kitchen olmesartan (BENICAR) 40 MG tablet Take 1 tablet by mouth once daily.   No current facility-administered medications for this visit.   Allergies: Review of  patient's allergies indicates no known allergies.  Social and Family History  Social History reports that she has never smoked. She has never used smokeless tobacco. She reports that she drinks alcohol. She reports that she does not use illicit drugs.  Family History Family History  Problem Relation Age of Onset  . Stroke Mother 43  . Stomach cancer Father 2  . Hypertension Sister  . Hypertension Sister   Review of Systems  Review of Systems  Constitutional: Negative for chills, diaphoresis, fever, malaise/fatigue and weight loss.  HENT: Negative for congestion, ear discharge, hearing loss and tinnitus.  Eyes: Negative for blurred vision.  Respiratory: Positive for shortness of breath. Negative for cough, hemoptysis, sputum production and wheezing.  Cardiovascular: Positive for chest pain. Negative for palpitations, orthopnea, claudication, leg swelling and PND.  Gastrointestinal: Negative for abdominal pain, blood in stool, constipation, diarrhea, heartburn, melena, nausea and vomiting.  Genitourinary: Negative for dysuria, frequency, hematuria and urgency.  Musculoskeletal: Negative for back pain, falls, joint pain and myalgias.  Skin: Negative for itching and rash.  Neurological: Negative for tingling, focal weakness, loss of consciousness, weakness and headaches.  Endo/Heme/Allergies: Negative for polydipsia. Does not bruise/bleed easily.  Psychiatric/Behavioral: Negative for depression, memory loss and substance abuse. The patient is not nervous/anxious.   Physical Examination   Vitals: BP (P) 124/76 (BP Location: Left upper arm, Patient Position: Sitting, BP Cuff Size: Adult)  Pulse (P) 80  Resp (P) 18  Ht (P) 163.8 cm (5' 4.5")  Wt (P) 61.7 kg (136  lb)  SpO2 (P) 99%  BMI (P) 22.98 kg/m2 Ht:(P) 163.8 cm (5' 4.5") Wt:(P) 61.7 kg (136 lb) [id number] surface area is 1.68 meters squared (pended). Body mass index is 22.98 kg/(m^2) (pended).  Wt Readings from Last 3  Encounters:  02/04/16 (P) 61.7 kg (136 lb)  01/12/16 62 kg (136 lb 9.6 oz)  07/14/15 61.2 kg (135 lb)   BP Readings from Last 3 Encounters:  02/04/16 (P) 124/76  01/12/16 142/80  07/14/15 132/70   General appearance appears in no acute distress  Head Mouth and Eye exam Normocephalic, without obvious abnormality, atraumatic Dentition is good Eyes appear anicteric   Neck exam Thyroid: normal  Nodes: no obvious adenopathy  LUNGS Breath Sounds: Normal Percussion: Normal  CARDIOVASCULAR JVP CV wave: no HJR: no Elevation at 90 degrees: None Carotid Pulse: normal pulsation bilaterally Bruit: None Apex: apical impulse normal  Auscultation Rhythm: normal sinus rhythm S1: normal S2: normal Clicks: no Rub: no Murmurs: 2-3/6 course crescendo decrescendo murmur radiating to the outflow tract and into her carotids. Gallop: None ABDOMEN Liver enlargement: no Pulsatile aorta: no Ascites: no Bruits: no  EXTREMITIES Clubbing: no Edema: trace to 1+ bilateral pedal edema Pulses: peripheral pulses symmetrical Femoral Bruits: no Amputation: no SKIN Rash: no Cyanosis: no Embolic phemonenon: no Bruising: no NEURO Alert and Oriented to person, place and time: yes Non focal: yes  PSYCH: Pt appears to have normal affect  Diagnostic Studies Reviewed:  EKG EKG demonstrated normal sinus rhythm, nonspecific ST and T waves changes.  Assessment and Plan   80 y.o. female with  ICD-10-CM ICD-9-CM   1. Aortic stenosis-severe by echo with a valve area of 0.64 cm with peak gradient of 67.9 mmHg and a mean gradient of 41 mmHg. Will need to evaluate her coronary anatomy and pulmonary pressures in preparation for consideration for valve replacement. Right left heart cath will be necessary to further evaluate this. Next  2. Hypertension-currently treated with amlodipine and Benicar. Will continue with this regimen including a low-sodium DASH diet  3. Attempt to keep LDL less  than 100. Low-fat diet is recommended.  Return in about 7 weeks (around 03/23/2016).  These notes generated with voice recognition software. I apologize for typographical errors.  Sydnee Levans, MD    H and P reviewed No changes

## 2016-02-13 NOTE — Discharge Instructions (Signed)
Angiogram, Care After °Refer to this sheet in the next few weeks. These instructions provide you with information about caring for yourself after your procedure. Your health care provider may also give you more specific instructions. Your treatment has been planned according to current medical practices, but problems sometimes occur. Call your health care provider if you have any problems or questions after your procedure. °WHAT TO EXPECT AFTER THE PROCEDURE °After your procedure, it is typical to have the following: °· Bruising at the catheter insertion site that usually fades within 1-2 weeks. °· Blood collecting in the tissue (hematoma) that may be painful to the touch. It should usually decrease in size and tenderness within 1-2 weeks. °HOME CARE INSTRUCTIONS °· Take medicines only as directed by your health care provider. °· You may shower 24-48 hours after the procedure or as directed by your health care provider. Remove the bandage (dressing) and gently wash the site with plain soap and water. Pat the area dry with a clean towel. Do not rub the site, because this may cause bleeding. °· Do not take baths, swim, or use a hot tub until your health care provider approves. °· Check your insertion site every day for redness, swelling, or drainage. °· Do not apply powder or lotion to the site. °· Do not lift over 10 lb (4.5 kg) for 5 days after your procedure or as directed by your health care provider. °· Ask your health care provider when it is okay to: °¨ Return to work or school. °¨ Resume usual physical activities or sports. °¨ Resume sexual activity. °· Do not drive home if you are discharged the same day as the procedure. Have someone else drive you. °· You may drive 24 hours after the procedure unless otherwise instructed by your health care provider. °· Do not operate machinery or power tools for 24 hours after the procedure or as directed by your health care provider. °· If your procedure was done as an  outpatient procedure, which means that you went home the same day as your procedure, a responsible adult should be with you for the first 24 hours after you arrive home. °· Keep all follow-up visits as directed by your health care provider. This is important. °SEEK MEDICAL CARE IF: °· You have a fever. °· You have chills. °· You have increased bleeding from the catheter insertion site. Hold pressure on the site. °SEEK IMMEDIATE MEDICAL CARE IF: °· You have unusual pain at the catheter insertion site. °· You have redness, warmth, or swelling at the catheter insertion site. °· You have drainage (other than a small amount of blood on the dressing) from the catheter insertion site. °· The catheter insertion site is bleeding, and the bleeding does not stop after 30 minutes of holding steady pressure on the site. °· The area near or just beyond the catheter insertion site becomes pale, cool, tingly, or numb. °  °This information is not intended to replace advice given to you by your health care provider. Make sure you discuss any questions you have with your health care provider. °  °Document Released: 12/10/2004 Document Revised: 06/14/2014 Document Reviewed: 10/25/2012 °Elsevier Interactive Patient Education ©2016 Elsevier Inc. ° °

## 2016-03-16 ENCOUNTER — Telehealth: Payer: Self-pay

## 2016-03-16 NOTE — Telephone Encounter (Signed)
Call to patient to see about scheduling her colonoscopy as the November and December schedule is out. She wants to hold off until she is seen in January next year.

## 2016-04-23 ENCOUNTER — Other Ambulatory Visit: Payer: Self-pay

## 2016-04-23 DIAGNOSIS — Z1231 Encounter for screening mammogram for malignant neoplasm of breast: Secondary | ICD-10-CM

## 2016-04-26 DIAGNOSIS — Z952 Presence of prosthetic heart valve: Secondary | ICD-10-CM | POA: Insufficient documentation

## 2016-04-26 HISTORY — PX: AORTIC VALVE REPLACEMENT: SHX41

## 2016-04-29 DIAGNOSIS — I9789 Other postprocedural complications and disorders of the circulatory system, not elsewhere classified: Secondary | ICD-10-CM | POA: Insufficient documentation

## 2016-06-21 ENCOUNTER — Encounter: Payer: Medicare Other | Attending: Cardiology | Admitting: *Deleted

## 2016-06-21 VITALS — Ht 64.8 in | Wt 133.0 lb

## 2016-06-21 DIAGNOSIS — Z954 Presence of other heart-valve replacement: Secondary | ICD-10-CM | POA: Diagnosis not present

## 2016-06-21 DIAGNOSIS — Z952 Presence of prosthetic heart valve: Secondary | ICD-10-CM

## 2016-06-21 NOTE — Progress Notes (Signed)
Cardiac Individual Treatment Plan  Patient Details  Name: Sheila Osborne MRN: [id number] Date of Birth: 1936/02/03 Referring Provider:   Flowsheet Row Cardiac Rehab from 06/21/2016 in [facility] Cardiac and Pulmonary Rehab  Referring Provider  Bartholome Bill MD      Initial Encounter Date:  Flowsheet Row Cardiac Rehab from 06/21/2016 in [facility] Cardiac and Pulmonary Rehab  Date  06/21/16  Referring Provider  Bartholome Bill MD      Visit Diagnosis: S/P TAVR (transcatheter aortic valve replacement)  Patient's Home Medications on Admission:  Current Outpatient Prescriptions:  .  alendronate (FOSAMAX) 70 MG tablet, Take 70 mg by mouth once a week. , Disp: , Rfl:  .  amLODipine (NORVASC) 5 MG tablet, Take 5 mg by mouth daily., Disp: , Rfl:  .  aspirin 325 MG tablet, Take 325 mg by mouth daily., Disp: , Rfl:  .  ketorolac (ACULAR) 0.5 % ophthalmic solution, Place 1 drop into the right eye 4 (four) times daily., Disp: 5 mL, Rfl: 0 .  metoprolol tartrate (LOPRESSOR) 12.5 mg TABS tablet, Take by mouth., Disp: , Rfl:  .  vitamin C (ASCORBIC ACID) 500 MG tablet, Take 500 mg by mouth daily., Disp: , Rfl:  .  BENICAR 40 MG tablet, Take 40 mg by mouth daily. , Disp: , Rfl:  .  clorazepate (TRANXENE) 3.75 MG tablet, Take 3.75 mg by mouth 2 (two) times daily as needed for anxiety., Disp: , Rfl:  .  Ergocalciferol (VITAMIN D2 PO), Take 1.25 mg by mouth once a week., Disp: , Rfl:   Past Medical History: Past Medical History:  Diagnosis Date  . Carotid bruit   . Hypertension   . Syncope 2012    Tobacco Use: History  Smoking Status  . Never Smoker  Smokeless Tobacco  . Never Used    Labs: Recent Review Flowsheet Data    There is no flowsheet data to display.       Exercise Target Goals: Date: 06/21/16  Exercise Program Goal: Individual exercise prescription set with THRR, safety & activity barriers. Participant demonstrates ability to understand and report RPE using BORG scale,  to self-measure pulse accurately, and to acknowledge the importance of the exercise prescription.  Exercise Prescription Goal: Starting with aerobic activity 30 plus minutes a day, 3 days per week for initial exercise prescription. Provide home exercise prescription and guidelines that participant acknowledges understanding prior to discharge.  Activity Barriers & Risk Stratification:     Activity Barriers & Cardiac Risk Stratification - 06/21/16 1131      Activity Barriers & Cardiac Risk Stratification   Activity Barriers Arthritis   Cardiac Risk Stratification High      6 Minute Walk:     6 Minute Walk    Row Name 06/21/16 1620         6 Minute Walk   Phase Initial     Distance 1600 feet     Walk Time 6 minutes     # of Rest Breaks 0     MPH 3.03     METS 3.14     RPE 12     VO2 Peak 11.01     Symptoms No     Resting HR 71 bpm     Resting BP 124/64     Max Ex. HR 94 bpm     Max Ex. BP 128/64     2 Minute Post BP 122/60        Initial Exercise Prescription:  Initial Exercise Prescription - 06/21/16 1600      Date of Initial Exercise RX and Referring Provider   Date 06/21/16   Referring Provider Bartholome Bill MD     Treadmill   MPH 2.8   Grade 0   Minutes 15   METs 3.14     NuStep   Level 2   Minutes 15   METs 2     Recumbant Elliptical   Level 1   RPM 50   Minutes 15   METs 2     Prescription Details   Frequency (times per week) 3   Duration Progress to 45 minutes of aerobic exercise without signs/symptoms of physical distress     Intensity   THRR 40-80% of Max Heartrate 99-128   Ratings of Perceived Exertion 11-15   Perceived Dyspnea 0-4     Progression   Progression Continue to progress workloads to maintain intensity without signs/symptoms of physical distress.     Resistance Training   Training Prescription Yes   Weight 3 lbs   Reps 10-15      Perform Capillary Blood Glucose checks as needed.  Exercise Prescription  Changes:     Exercise Prescription Changes    Row Name 06/21/16 1400             Exercise Review   Progression -  walk test results         Response to Exercise   Blood Pressure (Admit) 124/64       Blood Pressure (Exercise) 128/64       Blood Pressure (Exit) 122/60       Heart Rate (Admit) 71 bpm       Heart Rate (Exercise) 94 bpm       Heart Rate (Exit) 78 bpm       Oxygen Saturation (Admit) 99 %       Oxygen Saturation (Exercise) 99 %       Rating of Perceived Exertion (Exercise) 12          Exercise Comments:   Discharge Exercise Prescription (Final Exercise Prescription Changes):     Exercise Prescription Changes - 06/21/16 1400      Exercise Review   Progression --  walk test results     Response to Exercise   Blood Pressure (Admit) 124/64   Blood Pressure (Exercise) 128/64   Blood Pressure (Exit) 122/60   Heart Rate (Admit) 71 bpm   Heart Rate (Exercise) 94 bpm   Heart Rate (Exit) 78 bpm   Oxygen Saturation (Admit) 99 %   Oxygen Saturation (Exercise) 99 %   Rating of Perceived Exertion (Exercise) 12      Nutrition:  Target Goals: Understanding of nutrition guidelines, daily intake of sodium 1500mg , cholesterol 200mg , calories 30% from fat and 7% or less from saturated fats, daily to have 5 or more servings of fruits and vegetables.  Biometrics:     Pre Biometrics - 06/21/16 1622      Pre Biometrics   Height 5' 4.8" (1.646 m)   Weight 133 lb (60.3 kg)   Waist Circumference 28 inches   Hip Circumference 36 inches   Waist to Hip Ratio 0.78 %   BMI (Calculated) 22.3   Single Leg Stand 30 seconds       Nutrition Therapy Plan and Nutrition Goals:     Nutrition Therapy & Goals - 06/21/16 1453      Intervention Plan   Intervention Prescribe, educate and counsel regarding individualized specific dietary  modifications aiming towards targeted core components such as weight, hypertension, lipid management, diabetes, heart failure and other  comorbidities.   Expected Outcomes Short Term Goal: Understand basic principles of dietary content, such as calories, fat, sodium, cholesterol and nutrients.;Short Term Goal: A plan has been developed with personal nutrition goals set during dietitian appointment.;Long Term Goal: Adherence to prescribed nutrition plan.      Nutrition Discharge: Rate Your Plate Scores:     Nutrition Assessments - 06/21/16 1453      MEDFICTS Scores   Pre Score 33      Nutrition Goals Re-Evaluation:   Psychosocial: Target Goals: Acknowledge presence or absence of depression, maximize coping skills, provide positive support system. Participant is able to verbalize types and ability to use techniques and skills needed for reducing stress and depression.  Initial Review & Psychosocial Screening:     Initial Psych Review & Screening - 06/21/16 1457      Initial Review   Current issues with --  None reported     Family Dynamics   Good Support System? Yes     Barriers   Psychosocial barriers to participate in program There are no identifiable barriers or psychosocial needs.;The patient should benefit from training in stress management and relaxation.     Screening Interventions   Interventions Encouraged to exercise      Quality of Life Scores:     Quality of Life - 06/21/16 1100      Quality of Life Scores   Health/Function Pre 24.37 %   Socioeconomic Pre 25.5 %   Psych/Spiritual Pre 24 %   Family Pre 30 %   GLOBAL Pre 25.22 %      PHQ-9: Recent Review Flowsheet Data    Depression screen [facility] 2/9 06/21/2016   Decreased Interest 0   Down, Depressed, Hopeless 0   PHQ - 2 Score 0   Altered sleeping 0   Tired, decreased energy 0   Change in appetite 0   Feeling bad or failure about yourself  0   Trouble concentrating 0   Moving slowly or fidgety/restless 0   Suicidal thoughts 0   PHQ-9 Score 0      Psychosocial Evaluation and Intervention:   Psychosocial  Re-Evaluation:   Vocational Rehabilitation: Provide vocational rehab assistance to qualifying candidates.   Vocational Rehab Evaluation & Intervention:     Vocational Rehab - 06/21/16 1459      Initial Vocational Rehab Evaluation & Intervention   Assessment shows need for Vocational Rehabilitation No      Education: Education Goals: Education classes will be provided on a weekly basis, covering required topics. Participant will state understanding/return demonstration of topics presented.  Learning Barriers/Preferences:   Education Topics: General Nutrition Guidelines/Fats and Fiber: -Group instruction provided by verbal, written material, models and posters to present the general guidelines for heart healthy nutrition. Gives an explanation and review of dietary fats and fiber.   Controlling Sodium/Reading Food Labels: -Group verbal and written material supporting the discussion of sodium use in heart healthy nutrition. Review and explanation with models, verbal and written materials for utilization of the food label.   Exercise Physiology & Risk Factors: - Group verbal and written instruction with models to review the exercise physiology of the cardiovascular system and associated critical values. Details cardiovascular disease risk factors and the goals associated with each risk factor.   Aerobic Exercise & Resistance Training: - Gives group verbal and written discussion on the health impact of inactivity. On the  components of aerobic and resistive training programs and the benefits of this training and how to safely progress through these programs.   Flexibility, Balance, General Exercise Guidelines: - Provides group verbal and written instruction on the benefits of flexibility and balance training programs. Provides general exercise guidelines with specific guidelines to those with heart or lung disease. Demonstration and skill practice provided.   Stress Management: -  Provides group verbal and written instruction about the health risks of elevated stress, cause of high stress, and healthy ways to reduce stress.   Depression: - Provides group verbal and written instruction on the correlation between heart/lung disease and depressed mood, treatment options, and the stigmas associated with seeking treatment.   Anatomy & Physiology of the Heart: - Group verbal and written instruction and models provide basic cardiac anatomy and physiology, with the coronary electrical and arterial systems. Review of: AMI, Angina, Valve disease, Heart Failure, Cardiac Arrhythmia, Pacemakers, and the ICD.   Cardiac Procedures: - Group verbal and written instruction and models to describe the testing methods done to diagnose heart disease. Reviews the outcomes of the test results. Describes the treatment choices: Medical Management, Angioplasty, or Coronary Bypass Surgery.   Cardiac Medications: - Group verbal and written instruction to review commonly prescribed medications for heart disease. Reviews the medication, class of the drug, and side effects. Includes the steps to properly store meds and maintain the prescription regimen.   Go Sex-Intimacy & Heart Disease, Get SMART - Goal Setting: - Group verbal and written instruction through game format to discuss heart disease and the return to sexual intimacy. Provides group verbal and written material to discuss and apply goal setting through the application of the S.M.A.R.T. Method.   Other Matters of the Heart: - Provides group verbal, written materials and models to describe Heart Failure, Angina, Valve Disease, and Diabetes in the realm of heart disease. Includes description of the disease process and treatment options available to the cardiac patient.   Exercise & Equipment Safety: - Individual verbal instruction and demonstration of equipment use and safety with use of the equipment. Flowsheet Row Cardiac Rehab from  06/21/2016 in [facility] Cardiac and Pulmonary Rehab  Date  06/21/16  Educator  C.   Instruction Review Code  2- meets goals/outcomes      Infection Prevention: - Provides verbal and written material to individual with discussion of infection control including proper hand washing and proper equipment cleaning during exercise session. Flowsheet Row Cardiac Rehab from 06/21/2016 in [facility] Cardiac and Pulmonary Rehab  Date  06/21/16  Educator  sb  Instruction Review Code  2- meets goals/outcomes      Falls Prevention: - Provides verbal and written material to individual with discussion of falls prevention and safety. Flowsheet Row Cardiac Rehab from 06/21/2016 in [facility] Cardiac and Pulmonary Rehab  Date  06/21/16  Educator  sb  Instruction Review Code  2- meets goals/outcomes      Diabetes: - Individual verbal and written instruction to review signs/symptoms of diabetes, desired ranges of glucose level fasting, after meals and with exercise. Advice that pre and post exercise glucose checks will be done for 3 sessions at entry of program.    Knowledge Questionnaire Score:     Knowledge Questionnaire Score - 06/21/16 1459      Knowledge Questionnaire Score   Pre Score 25/28      Core Components/Risk Factors/Patient Goals at Admission:     Personal Goals and Risk Factors at Admission - 06/21/16 1453  Core Components/Risk Factors/Patient Goals on Admission   Hypertension Yes   Intervention Provide education on lifestyle modifcations including regular physical activity/exercise, weight management, moderate sodium restriction and increased consumption of fresh fruit, vegetables, and low fat dairy, alcohol moderation, and smoking cessation.;Monitor prescription use compliance.   Expected Outcomes Short Term: Continued assessment and intervention until BP is < 140/81mm HG in hypertensive participants. < 130/31mm HG in hypertensive participants with diabetes, heart failure or chronic  kidney disease.;Long Term: Maintenance of blood pressure at goal levels.   Stress Yes  General stress of life. HAd been taking Xanax. HAs stopped the med and is doing Yoga daily, which helps keep stress levels low.   Intervention Offer individual and/or small group education and counseling on adjustment to heart disease, stress management and health-related lifestyle change. Teach and support self-help strategies.;Refer participants experiencing significant psychosocial distress to appropriate mental health specialists for further evaluation and treatment. When possible, include family members and significant others in education/counseling sessions.   Expected Outcomes Short Term: Participant demonstrates changes in health-related behavior, relaxation and other stress management skills, ability to obtain effective social support, and compliance with psychotropic medications if prescribed.;Long Term: Emotional wellbeing is indicated by absence of clinically significant psychosocial distress or social isolation.      Core Components/Risk Factors/Patient Goals Review:    Core Components/Risk Factors/Patient Goals at Discharge (Final Review):    ITP Comments:     ITP Comments    Row Name 06/21/16 1444           ITP Comments Medical review completed. Initial ITP created. Documentation of diagnosis can be found [facility] 04/26/2016          Comments:Initial ITP

## 2016-06-21 NOTE — Patient Instructions (Signed)
Patient Instructions  Patient Details  Name: Sheila Osborne MRN: [id number] Date of Birth: 20-Jul-1935 Referring Provider:  Teodoro Spray, MD  Below are the personal goals you chose as well as exercise and nutrition goals. Our goal is to help you keep on track towards obtaining and maintaining your goals. We will be discussing your progress on these goals with you throughout the program.  Initial Exercise Prescription:     Initial Exercise Prescription - 06/21/16 1600      Date of Initial Exercise RX and Referring Provider   Date 06/21/16   Referring Provider Bartholome Bill MD     Treadmill   MPH 2.8   Grade 0   Minutes 15   METs 3.14     NuStep   Level 2   Minutes 15   METs 2     Recumbant Elliptical   Level 1   RPM 50   Minutes 15   METs 2     Prescription Details   Frequency (times per week) 3   Duration Progress to 45 minutes of aerobic exercise without signs/symptoms of physical distress     Intensity   THRR 40-80% of Max Heartrate 99-128   Ratings of Perceived Exertion 11-15   Perceived Dyspnea 0-4     Progression   Progression Continue to progress workloads to maintain intensity without signs/symptoms of physical distress.     Resistance Training   Training Prescription Yes   Weight 3 lbs   Reps 10-15      Exercise Goals: Frequency: Be able to perform aerobic exercise three times per week working toward 3-5 days per week.  Intensity: Work with a perceived exertion of 11 (fairly light) - 15 (hard) as tolerated. Follow your new exercise prescription and watch for changes in prescription as you progress with the program. Changes will be reviewed with you when they are made.  Duration: You should be able to do 30 minutes of continuous aerobic exercise in addition to a 5 minute warm-up and a 5 minute cool-down routine.  Nutrition Goals: Your personal nutrition goals will be established when you do your nutrition analysis with the  dietician.  The following are nutrition guidelines to follow: Cholesterol < 200mg /day Sodium < 1500mg /day Fiber: Women over 50 yrs - 21 grams per day  Personal Goals:     Personal Goals and Risk Factors at Admission - 06/21/16 1453      Core Components/Risk Factors/Patient Goals on Admission   Hypertension Yes   Intervention Provide education on lifestyle modifcations including regular physical activity/exercise, weight management, moderate sodium restriction and increased consumption of fresh fruit, vegetables, and low fat dairy, alcohol moderation, and smoking cessation.;Monitor prescription use compliance.   Expected Outcomes Short Term: Continued assessment and intervention until BP is < 140/35mm HG in hypertensive participants. < 130/32mm HG in hypertensive participants with diabetes, heart failure or chronic kidney disease.;Long Term: Maintenance of blood pressure at goal levels.   Stress Yes  General stress of life. HAd been taking Xanax. HAs stopped the med and is doing Yoga daily, which helps keep stress levels low.   Intervention Offer individual and/or small group education and counseling on adjustment to heart disease, stress management and health-related lifestyle change. Teach and support self-help strategies.;Refer participants experiencing significant psychosocial distress to appropriate mental health specialists for further evaluation and treatment. When possible, include family members and significant others in education/counseling sessions.   Expected Outcomes Short Term: Participant demonstrates changes in health-related behavior, relaxation and  other stress management skills, ability to obtain effective social support, and compliance with psychotropic medications if prescribed.;Long Term: Emotional wellbeing is indicated by absence of clinically significant psychosocial distress or social isolation.      Tobacco Use Initial Evaluation: History  Smoking Status  . Never  Smoker  Smokeless Tobacco  . Never Used    Copy of goals given to participant.

## 2016-06-29 ENCOUNTER — Encounter: Payer: Medicare Other | Admitting: Respiratory Therapy

## 2016-06-29 DIAGNOSIS — Z954 Presence of other heart-valve replacement: Secondary | ICD-10-CM | POA: Diagnosis not present

## 2016-06-29 DIAGNOSIS — Z952 Presence of prosthetic heart valve: Secondary | ICD-10-CM

## 2016-06-29 NOTE — Progress Notes (Signed)
Daily Session Note  Patient Details  Name: Sheila Osborne MRN: [id number] Date of Birth: 05-06-1936 Referring Provider:   Flowsheet Row Cardiac Rehab from 06/21/2016 in [facility] Cardiac and Pulmonary Rehab  Referring Provider  Bartholome Bill MD      Encounter Date: 06/29/2016  Check In:     Session Check In - 06/29/16 0950      Check-In   Location [facility]-Cardiac & Pulmonary Rehab   Staff Present Alberteen Sam, MA, ACSM RCEP, Exercise Physiologist;Susanne Bice, RN, BSN, CCRP;Laureen Owens Shark, BS, RRT, Respiratory Therapist   Supervising physician immediately available to respond to emergencies See telemetry face sheet for immediately available ER MD   Medication changes reported     No   Fall or balance concerns reported    No   Warm-up and Cool-down Performed on first and last piece of equipment   Resistance Training Performed Yes   VAD Patient? No     Pain Assessment   Currently in Pain? No/denies   Multiple Pain Sites No         Goals Met:  Exercise tolerated well Personal goals reviewed No report of cardiac concerns or symptoms Strength training completed today  Goals Unmet:  Not Applicable  Comments: First full day of exercise!  Patient was oriented to gym and equipment including functions, settings, policies, and procedures.  Patient's individual exercise prescription and treatment plan were reviewed.  All starting workloads were established based on the results of the 6 minute walk test done at initial orientation visit.  The plan for exercise progression was also introduced and progression will be customized based on patient's performance and goals.    Dr. Emily Filbert is Medical Director for [place] and [facility].

## 2016-06-29 NOTE — Progress Notes (Signed)
Daily Session Note  Patient Details  Name: Pierina Schuknecht MRN: [id number] Date of Birth: 08/18/1935 Referring Provider:   Flowsheet Row Cardiac Rehab from 06/21/2016 in [facility] Cardiac and Pulmonary Rehab  Referring Provider  Bartholome Bill MD      Encounter Date: 06/29/2016  Check In:     Session Check In - 06/29/16 0950      Check-In   Location [facility]-Cardiac & Pulmonary Rehab   Staff Present Alberteen Sam, MA, ACSM RCEP, Exercise Physiologist;Susanne Bice, RN, BSN, CCRP;Laureen Owens Shark, BS, RRT, Respiratory Therapist   Supervising physician immediately available to respond to emergencies See telemetry face sheet for immediately available ER MD   Medication changes reported     No   Fall or balance concerns reported    No   Warm-up and Cool-down Performed on first and last piece of equipment   Resistance Training Performed Yes   VAD Patient? No     Pain Assessment   Currently in Pain? No/denies   Multiple Pain Sites No         Goals Met:  Proper associated with RPD/PD & O2 Sat Exercise tolerated well Personal goals reviewed No report of cardiac concerns or symptoms Strength training completed today  Goals Unmet:  Not Applicable  Comments: Pt able to follow exercise prescription today without complaint.  Will continue to monitor for progression.   Dr. Emily Filbert is Medical Director for [place] and [facility].

## 2016-07-01 ENCOUNTER — Encounter: Payer: Medicare Other | Admitting: *Deleted

## 2016-07-01 DIAGNOSIS — Z954 Presence of other heart-valve replacement: Secondary | ICD-10-CM | POA: Diagnosis not present

## 2016-07-01 DIAGNOSIS — Z952 Presence of prosthetic heart valve: Secondary | ICD-10-CM

## 2016-07-01 NOTE — Progress Notes (Signed)
Daily Session Note  Patient Details  Name: Sheila Osborne MRN: [id number] Date of Birth: [birth year] Referring Provider:   Flowsheet Row Cardiac Rehab from 06/21/2016 in [facility] Cardiac and Pulmonary Rehab  Referring Provider  Bartholome Bill MD      Encounter Date: 07/01/2016  Check In:     Session Check In - 07/01/16 0826      Check-In   Location [facility]-Cardiac & Pulmonary Rehab   Staff Present Alberteen Sam, MA, ACSM RCEP, Exercise Physiologist;Patricia Surles RN Vickki Hearing, BA, ACSM CEP, Exercise Physiologist   Supervising physician immediately available to respond to emergencies See telemetry face sheet for immediately available ER MD   Medication changes reported     No   Fall or balance concerns reported    No   Warm-up and Cool-down Performed on first and last piece of equipment   Resistance Training Performed Yes   VAD Patient? No     Pain Assessment   Currently in Pain? No/denies   Multiple Pain Sites No         Goals Met:  Independence with exercise equipment Exercise tolerated well No report of cardiac concerns or symptoms Strength training completed today  Goals Unmet:  Not Applicable  Comments: Pt able to follow exercise prescription today without complaint.  Will continue to monitor for progression.    Dr. Emily Filbert is Medical Director for [place] and [facility].

## 2016-07-02 ENCOUNTER — Encounter: Payer: Medicare Other | Admitting: *Deleted

## 2016-07-02 DIAGNOSIS — Z952 Presence of prosthetic heart valve: Secondary | ICD-10-CM

## 2016-07-02 DIAGNOSIS — Z954 Presence of other heart-valve replacement: Secondary | ICD-10-CM | POA: Diagnosis not present

## 2016-07-02 NOTE — Progress Notes (Signed)
Daily Session Note  Patient Details  Name: Sheila Osborne MRN: [id number] Date of Birth: January 26, 1936 Referring Provider:   Flowsheet Row Cardiac Rehab from 06/21/2016 in [facility] Cardiac and Pulmonary Rehab  Referring Provider  Bartholome Bill MD      Encounter Date: 07/02/2016  Check In:     Session Check In - 07/02/16 0835      Check-In   Location [facility]-Cardiac & Pulmonary Rehab   Staff Present Alberteen Sam, MA, ACSM RCEP, Exercise Physiologist;Carroll Enterkin, RN, BSN;Other   Supervising physician immediately available to respond to emergencies See telemetry face sheet for immediately available ER MD   Medication changes reported     No   Fall or balance concerns reported    No   Warm-up and Cool-down Performed on first and last piece of equipment   Resistance Training Performed Yes   VAD Patient? No     Pain Assessment   Currently in Pain? No/denies   Multiple Pain Sites No         Goals Met:  Independence with exercise equipment Exercise tolerated well No report of cardiac concerns or symptoms Strength training completed today  Goals Unmet:  Not Applicable  Comments: Pt able to follow exercise prescription today without complaint.  Will continue to monitor for progression.    Dr. Emily Filbert is Medical Director for [place] and [facility].

## 2016-07-05 ENCOUNTER — Ambulatory Visit
Admission: RE | Admit: 2016-07-05 | Discharge: 2016-07-05 | Disposition: A | Discharge status: Home / Self Care | Payer: Medicare Other | Source: Ambulatory Visit | Attending: General Surgery | Admitting: General Surgery

## 2016-07-05 DIAGNOSIS — Z1231 Encounter for screening mammogram for malignant neoplasm of breast: Secondary | ICD-10-CM | POA: Diagnosis not present

## 2016-07-06 ENCOUNTER — Other Ambulatory Visit: Payer: Self-pay | Admitting: General Surgery

## 2016-07-06 ENCOUNTER — Encounter: Payer: Medicare Other | Admitting: Respiratory Therapy

## 2016-07-06 DIAGNOSIS — Z952 Presence of prosthetic heart valve: Secondary | ICD-10-CM

## 2016-07-06 DIAGNOSIS — R928 Other abnormal and inconclusive findings on diagnostic imaging of breast: Secondary | ICD-10-CM

## 2016-07-06 DIAGNOSIS — N6320 Unspecified lump in the left breast, unspecified quadrant: Secondary | ICD-10-CM

## 2016-07-06 DIAGNOSIS — Z954 Presence of other heart-valve replacement: Secondary | ICD-10-CM | POA: Diagnosis not present

## 2016-07-06 NOTE — Progress Notes (Signed)
Daily Session Note  Patient Details  Name: Sheila Osborne MRN: [id number] Date of Birth: 04/02/1936 Referring Provider:   Flowsheet Row Cardiac Rehab from 06/21/2016 in [facility] Cardiac and Pulmonary Rehab  Referring Provider  Bartholome Bill MD      Encounter Date: 07/06/2016  Check In:     Session Check In - 07/06/16 0853      Check-In   Location [facility]-Cardiac & Pulmonary Rehab   Staff Present Alberteen Sam, MA, ACSM RCEP, Exercise Physiologist;Susanne Bice, RN, BSN, CCRP;Laureen Owens Shark, BS, RRT, Respiratory Therapist   Supervising physician immediately available to respond to emergencies See telemetry face sheet for immediately available ER MD   Medication changes reported     No   Fall or balance concerns reported    No   Warm-up and Cool-down Performed on first and last piece of equipment   Resistance Training Performed Yes   VAD Patient? No     Pain Assessment   Currently in Pain? No/denies   Multiple Pain Sites No         Goals Met:  Independence with exercise equipment Exercise tolerated well No report of cardiac concerns or symptoms Strength training completed today  Goals Unmet:  Not Applicable  Comments: Pt able to follow exercise prescription today without complaint.  Will continue to monitor for progression.    Dr. Emily Filbert is Medical Director for [place] and [facility].

## 2016-07-07 ENCOUNTER — Encounter: Payer: Self-pay | Admitting: *Deleted

## 2016-07-07 DIAGNOSIS — Z952 Presence of prosthetic heart valve: Secondary | ICD-10-CM

## 2016-07-07 NOTE — Progress Notes (Signed)
Cardiac Individual Treatment Plan  Patient Details  Name: Sheila Osborne MRN: [id number] Date of Birth: May 01, 1936 Referring Provider:   Flowsheet Row Cardiac Rehab from 06/21/2016 in [facility] Cardiac and Pulmonary Rehab  Referring Provider  Bartholome Bill MD      Initial Encounter Date:  Flowsheet Row Cardiac Rehab from 06/21/2016 in [facility] Cardiac and Pulmonary Rehab  Date  06/21/16  Referring Provider  Bartholome Bill MD      Visit Diagnosis: S/P TAVR (transcatheter aortic valve replacement)  Patient's Home Medications on Admission:  Current Outpatient Prescriptions:  .  alendronate (FOSAMAX) 70 MG tablet, Take 70 mg by mouth once a week. , Disp: , Rfl:  .  amLODipine (NORVASC) 5 MG tablet, Take 5 mg by mouth daily., Disp: , Rfl:  .  aspirin 325 MG tablet, Take 325 mg by mouth daily., Disp: , Rfl:  .  BENICAR 40 MG tablet, Take 40 mg by mouth daily. , Disp: , Rfl:  .  clorazepate (TRANXENE) 3.75 MG tablet, Take 3.75 mg by mouth 2 (two) times daily as needed for anxiety., Disp: , Rfl:  .  Ergocalciferol (VITAMIN D2 PO), Take 1.25 mg by mouth once a week., Disp: , Rfl:  .  ketorolac (ACULAR) 0.5 % ophthalmic solution, Place 1 drop into the right eye 4 (four) times daily., Disp: 5 mL, Rfl: 0 .  metoprolol tartrate (LOPRESSOR) 12.5 mg TABS tablet, Take by mouth., Disp: , Rfl:  .  vitamin C (ASCORBIC ACID) 500 MG tablet, Take 500 mg by mouth daily., Disp: , Rfl:   Past Medical History: Past Medical History:  Diagnosis Date  . Carotid bruit   . Hypertension   . Syncope 2012    Tobacco Use: History  Smoking Status  . Never Smoker  Smokeless Tobacco  . Never Used    Labs: Recent Review Flowsheet Data    There is no flowsheet data to display.       Exercise Target Goals:    Exercise Program Goal: Individual exercise prescription set with THRR, safety & activity barriers. Participant demonstrates ability to understand and report RPE using BORG scale, to  self-measure pulse accurately, and to acknowledge the importance of the exercise prescription.  Exercise Prescription Goal: Starting with aerobic activity 30 plus minutes a day, 3 days per week for initial exercise prescription. Provide home exercise prescription and guidelines that participant acknowledges understanding prior to discharge.  Activity Barriers & Risk Stratification:     Activity Barriers & Cardiac Risk Stratification - 06/21/16 1131      Activity Barriers & Cardiac Risk Stratification   Activity Barriers Arthritis   Cardiac Risk Stratification High      6 Minute Walk:     6 Minute Walk    Row Name 06/21/16 1620         6 Minute Walk   Phase Initial     Distance 1600 feet     Walk Time 6 minutes     # of Rest Breaks 0     MPH 3.03     METS 3.14     RPE 12     VO2 Peak 11.01     Symptoms No     Resting HR 71 bpm     Resting BP 124/64     Max Ex. HR 94 bpm     Max Ex. BP 128/64     2 Minute Post BP 122/60        Initial Exercise Prescription:  Initial Exercise Prescription - 06/21/16 1600      Date of Initial Exercise RX and Referring Provider   Date 06/21/16   Referring Provider Bartholome Bill MD     Treadmill   MPH 2.8   Grade 0   Minutes 15   METs 3.14     NuStep   Level 2   Minutes 15   METs 2     Recumbant Elliptical   Level 1   RPM 50   Minutes 15   METs 2     Prescription Details   Frequency (times per week) 3   Duration Progress to 45 minutes of aerobic exercise without signs/symptoms of physical distress     Intensity   THRR 40-80% of Max Heartrate 99-128   Ratings of Perceived Exertion 11-15   Perceived Dyspnea 0-4     Progression   Progression Continue to progress workloads to maintain intensity without signs/symptoms of physical distress.     Resistance Training   Training Prescription Yes   Weight 3 lbs   Reps 10-15      Perform Capillary Blood Glucose checks as needed.  Exercise Prescription  Changes:     Exercise Prescription Changes    Row Name 06/21/16 1400 06/29/16 1400           Exercise Review   Progression -  walk test results -  First Full Day of Exercise        Response to Exercise   Blood Pressure (Admit) 124/64 148/90      Blood Pressure (Exercise) 128/64 158/84      Blood Pressure (Exit) 122/60 146/80      Heart Rate (Admit) 71 bpm 69 bpm      Heart Rate (Exercise) 94 bpm 105 bpm      Heart Rate (Exit) 78 bpm 67 bpm      Oxygen Saturation (Admit) 99 %  -      Oxygen Saturation (Exercise) 99 %  -      Rating of Perceived Exertion (Exercise) 12 13      Symptoms  - none      Duration  - Progress to 45 minutes of aerobic exercise without signs/symptoms of physical distress      Intensity  - THRR unchanged        Progression   Progression  - Continue to progress workloads to maintain intensity without signs/symptoms of physical distress.      Average METs  - 3.7        Resistance Training   Training Prescription  - Yes      Weight  - 3 lbs      Reps  - 10-15        Interval Training   Interval Training  - No        NuStep   Level  - 2      Minutes  - 15      METs  - 2.9        REL-XR   Level  - 2      Minutes  - 15      METs  - 4.5         Exercise Comments:     Exercise Comments    Row Name 06/29/16 1028           Exercise Comments First full day of exercise!  Patient was oriented to gym and equipment including functions, settings, policies, and procedures.  Patient's individual exercise prescription  and treatment plan were reviewed.  All starting workloads were established based on the results of the 6 minute walk test done at initial orientation visit.  The plan for exercise progression was also introduced and progression will be customized based on patient's performance and goals.          Discharge Exercise Prescription (Final Exercise Prescription Changes):     Exercise Prescription Changes - 06/29/16 1400      Exercise  Review   Progression --  First Full Day of Exercise     Response to Exercise   Blood Pressure (Admit) 148/90   Blood Pressure (Exercise) 158/84   Blood Pressure (Exit) 146/80   Heart Rate (Admit) 69 bpm   Heart Rate (Exercise) 105 bpm   Heart Rate (Exit) 67 bpm   Rating of Perceived Exertion (Exercise) 13   Symptoms none   Duration Progress to 45 minutes of aerobic exercise without signs/symptoms of physical distress   Intensity THRR unchanged     Progression   Progression Continue to progress workloads to maintain intensity without signs/symptoms of physical distress.   Average METs 3.7     Resistance Training   Training Prescription Yes   Weight 3 lbs   Reps 10-15     Interval Training   Interval Training No     NuStep   Level 2   Minutes 15   METs 2.9     REL-XR   Level 2   Minutes 15   METs 4.5      Nutrition:  Target Goals: Understanding of nutrition guidelines, daily intake of sodium <1575m, cholesterol <2040m calories 30% from fat and 7% or less from saturated fats, daily to have 5 or more servings of fruits and vegetables.  Biometrics:     Pre Biometrics - 06/21/16 1622      Pre Biometrics   Height 5' 4.8" (1.646 m)   Weight 133 lb (60.3 kg)   Waist Circumference 28 inches   Hip Circumference 36 inches   Waist to Hip Ratio 0.78 %   BMI (Calculated) 22.3   Single Leg Stand 30 seconds       Nutrition Therapy Plan and Nutrition Goals:     Nutrition Therapy & Goals - 06/21/16 1453      Intervention Plan   Intervention Prescribe, educate and counsel regarding individualized specific dietary modifications aiming towards targeted core components such as weight, hypertension, lipid management, diabetes, heart failure and other comorbidities.   Expected Outcomes Short Term Goal: Understand basic principles of dietary content, such as calories, fat, sodium, cholesterol and nutrients.;Short Term Goal: A plan has been developed with personal nutrition  goals set during dietitian appointment.;Long Term Goal: Adherence to prescribed nutrition plan.      Nutrition Discharge: Rate Your Plate Scores:     Nutrition Assessments - 06/21/16 1453      MEDFICTS Scores   Pre Score 33      Nutrition Goals Re-Evaluation:   Psychosocial: Target Goals: Acknowledge presence or absence of depression, maximize coping skills, provide positive support system. Participant is able to verbalize types and ability to use techniques and skills needed for reducing stress and depression.  Initial Review & Psychosocial Screening:     Initial Psych Review & Screening - 06/21/16 1457      Initial Review   Current issues with --  None reported     Family Dynamics   Good Support System? Yes     Barriers   Psychosocial barriers to  participate in program There are no identifiable barriers or psychosocial needs.;The patient should benefit from training in stress management and relaxation.     Screening Interventions   Interventions Encouraged to exercise      Quality of Life Scores:     Quality of Life - 06/21/16 1100      Quality of Life Scores   Health/Function Pre 24.37 %   Socioeconomic Pre 25.5 %   Psych/Spiritual Pre 24 %   Family Pre 30 %   GLOBAL Pre 25.22 %      PHQ-9: Recent Review Flowsheet Data    Depression screen [facility] 2/9 06/21/2016   Decreased Interest 0   Down, Depressed, Hopeless 0   PHQ - 2 Score 0   Altered sleeping 0   Tired, decreased energy 0   Change in appetite 0   Feeling bad or failure about yourself  0   Trouble concentrating 0   Moving slowly or fidgety/restless 0   Suicidal thoughts 0   PHQ-9 Score 0      Psychosocial Evaluation and Intervention:     Psychosocial Evaluation - 06/29/16 0928      Psychosocial Evaluation & Interventions   Interventions Stress management education;Relaxation education;Encouraged to exercise with the program and follow exercise prescription   Comments Counselor met  with Ms. S today for initial psychosocial evaluation.  She is a 81 year old who had Aortic Valve Replacement this past November.  She lives alone but reports having a strong support system with great neighbors closeby.  She reports sleeping well and having a good appetite.  Ms. Chauncey Cruel denies a history or current symptoms of depression but admits to chronic years of anxiety.  She was on medication for that prior to her surgery but "weaned herself off."  She states that other than her health, she really has minimal stress in her life.  She practices yoga approximately 5x/week for stress management and to reduce her anxiety levels.  Counselor requested a follow up with Ms. S to determine if her anxiety levels decrease with consistent exercise and participation in the Stress management and relaxation educational components of this program.  Staff will follow with Ms. S throughout the course of this program.     Continued Psychosocial Services Needed Yes  Follow up re: anxiety - whether needs further Tx       Psychosocial Re-Evaluation:   Vocational Rehabilitation: Provide vocational rehab assistance to qualifying candidates.   Vocational Rehab Evaluation & Intervention:     Vocational Rehab - 06/21/16 1459      Initial Vocational Rehab Evaluation & Intervention   Assessment shows need for Vocational Rehabilitation No      Education: Education Goals: Education classes will be provided on a weekly basis, covering required topics. Participant will state understanding/return demonstration of topics presented.  Learning Barriers/Preferences:   Education Topics: General Nutrition Guidelines/Fats and Fiber: -Group instruction provided by verbal, written material, models and posters to present the general guidelines for heart healthy nutrition. Gives an explanation and review of dietary fats and fiber.   Controlling Sodium/Reading Food Labels: -Group verbal and written material supporting the  discussion of sodium use in heart healthy nutrition. Review and explanation with models, verbal and written materials for utilization of the food label.   Exercise Physiology & Risk Factors: - Group verbal and written instruction with models to review the exercise physiology of the cardiovascular system and associated critical values. Details cardiovascular disease risk factors and the goals associated with  each risk factor. Flowsheet Row Cardiac Rehab from 07/06/2016 in [facility] Cardiac and Pulmonary Rehab  Date  06/29/16  Educator  [facility]  Instruction Review Code  2- meets goals/outcomes      Aerobic Exercise & Resistance Training: - Gives group verbal and written discussion on the health impact of inactivity. On the components of aerobic and resistive training programs and the benefits of this training and how to safely progress through these programs. Flowsheet Row Cardiac Rehab from 07/06/2016 in [facility] Cardiac and Pulmonary Rehab  Date  07/01/16  Educator  AS  Instruction Review Code  2- meets goals/outcomes      Flexibility, Balance, General Exercise Guidelines: - Provides group verbal and written instruction on the benefits of flexibility and balance training programs. Provides general exercise guidelines with specific guidelines to those with heart or lung disease. Demonstration and skill practice provided. Flowsheet Row Cardiac Rehab from 07/06/2016 in [facility] Cardiac and Pulmonary Rehab  Date  07/06/16  Educator  [facility]  Instruction Review Code  2- meets goals/outcomes      Stress Management: - Provides group verbal and written instruction about the health risks of elevated stress, cause of high stress, and healthy ways to reduce stress.   Depression: - Provides group verbal and written instruction on the correlation between heart/lung disease and depressed mood, treatment options, and the stigmas associated with seeking treatment.   Anatomy & Physiology of the Heart: - Group verbal and  written instruction and models provide basic cardiac anatomy and physiology, with the coronary electrical and arterial systems. Review of: AMI, Angina, Valve disease, Heart Failure, Cardiac Arrhythmia, Pacemakers, and the ICD.   Cardiac Procedures: - Group verbal and written instruction and models to describe the testing methods done to diagnose heart disease. Reviews the outcomes of the test results. Describes the treatment choices: Medical Management, Angioplasty, or Coronary Bypass Surgery.   Cardiac Medications: - Group verbal and written instruction to review commonly prescribed medications for heart disease. Reviews the medication, class of the drug, and side effects. Includes the steps to properly store meds and maintain the prescription regimen.   Go Sex-Intimacy & Heart Disease, Get SMART - Goal Setting: - Group verbal and written instruction through game format to discuss heart disease and the return to sexual intimacy. Provides group verbal and written material to discuss and apply goal setting through the application of the S.M.A.R.T. Method.   Other Matters of the Heart: - Provides group verbal, written materials and models to describe Heart Failure, Angina, Valve Disease, and Diabetes in the realm of heart disease. Includes description of the disease process and treatment options available to the cardiac patient.   Exercise & Equipment Safety: - Individual verbal instruction and demonstration of equipment use and safety with use of the equipment. Flowsheet Row Cardiac Rehab from 07/06/2016 in [facility] Cardiac and Pulmonary Rehab  Date  06/21/16  Educator  C.   Instruction Review Code  2- meets goals/outcomes      Infection Prevention: - Provides verbal and written material to individual with discussion of infection control including proper hand washing and proper equipment cleaning during exercise session. Flowsheet Row Cardiac Rehab from 07/06/2016 in [facility] Cardiac and Pulmonary  Rehab  Date  06/21/16  Educator  sb  Instruction Review Code  2- meets goals/outcomes      Falls Prevention: - Provides verbal and written material to individual with discussion of falls prevention and safety. Flowsheet Row Cardiac Rehab from 07/06/2016 in [facility] Cardiac and Pulmonary Rehab  Date  06/21/16  Educator  sb  Instruction Review Code  2- meets goals/outcomes      Diabetes: - Individual verbal and written instruction to review signs/symptoms of diabetes, desired ranges of glucose level fasting, after meals and with exercise. Advice that pre and post exercise glucose checks will be done for 3 sessions at entry of program.    Knowledge Questionnaire Score:     Knowledge Questionnaire Score - 06/21/16 1459      Knowledge Questionnaire Score   Pre Score 25/28      Core Components/Risk Factors/Patient Goals at Admission:     Personal Goals and Risk Factors at Admission - 06/21/16 1453      Core Components/Risk Factors/Patient Goals on Admission   Hypertension Yes   Intervention Provide education on lifestyle modifcations including regular physical activity/exercise, weight management, moderate sodium restriction and increased consumption of fresh fruit, vegetables, and low fat dairy, alcohol moderation, and smoking cessation.;Monitor prescription use compliance.   Expected Outcomes Short Term: Continued assessment and intervention until BP is < 140/20m HG in hypertensive participants. < 130/863mHG in hypertensive participants with diabetes, heart failure or chronic kidney disease.;Long Term: Maintenance of blood pressure at goal levels.   Stress Yes  General stress of life. HAd been taking Xanax. HAs stopped the med and is doing Yoga daily, which helps keep stress levels low.   Intervention Offer individual and/or small group education and counseling on adjustment to heart disease, stress management and health-related lifestyle change. Teach and support self-help  strategies.;Refer participants experiencing significant psychosocial distress to appropriate mental health specialists for further evaluation and treatment. When possible, include family members and significant others in education/counseling sessions.   Expected Outcomes Short Term: Participant demonstrates changes in health-related behavior, relaxation and other stress management skills, ability to obtain effective social support, and compliance with psychotropic medications if prescribed.;Long Term: Emotional wellbeing is indicated by absence of clinically significant psychosocial distress or social isolation.      Core Components/Risk Factors/Patient Goals Review:    Core Components/Risk Factors/Patient Goals at Discharge (Final Review):    ITP Comments:     ITP Comments    Row Name 06/21/16 1444 07/07/16 0620         ITP Comments Medical review completed. Initial ITP created. Documentation of diagnosis can be found [facility] surgery 04/26/2016 30 day review. Continue with ITP unless directed changes per Medical Director review. New to program         Comments:

## 2016-07-08 ENCOUNTER — Encounter: Payer: Medicare Other | Attending: Cardiology | Admitting: *Deleted

## 2016-07-08 DIAGNOSIS — Z952 Presence of prosthetic heart valve: Secondary | ICD-10-CM

## 2016-07-08 DIAGNOSIS — Z954 Presence of other heart-valve replacement: Secondary | ICD-10-CM | POA: Insufficient documentation

## 2016-07-08 NOTE — Progress Notes (Addendum)
Daily Session Note  Patient Details  Name: Cashmere Dingley MRN: [id number] Date of Birth: 04/17/1936 Referring Provider:   Flowsheet Row Cardiac Rehab from 06/21/2016 in [facility] Cardiac and Pulmonary Rehab  Referring Provider  Bartholome Bill MD      Encounter Date: 07/08/2016  Check In:     Session Check In - 07/08/16 0921      Check-In   Location [facility]-Cardiac & Pulmonary Rehab   Staff Present Alberteen Sam, MA, ACSM RCEP, Exercise Physiologist;Susanne Bice, RN, BSN, CCRP;Other;Amanda Oletta Darter, BA, ACSM CEP, Exercise Physiologist   Supervising physician immediately available to respond to emergencies See telemetry face sheet for immediately available ER MD   Medication changes reported     No   Fall or balance concerns reported    No   Warm-up and Cool-down Performed on first and last piece of equipment   Resistance Training Performed Yes   VAD Patient? No     Pain Assessment   Currently in Pain? No/denies   Multiple Pain Sites No         Goals Met:  Independence with exercise equipment Exercise tolerated well No report of cardiac concerns or symptoms Strength training completed today  Goals Unmet:  Not Applicable  Comments: Pt able to follow exercise prescription today without complaint.  Will continue to monitor for progression. Reviewed home exercise with pt today.  Pt plans to walk and do yoga at home for exercise.  Angie was encouraged to try to attend a yoga class in person to make sure she is using proper form and technique.  Reviewed THR, pulse, RPE, sign and symptoms, NTG use, and when to call 911 or MD.  Also discussed weather considerations and indoor options.  Pt voiced understanding.    Dr. Emily Filbert is Medical Director for [place] and [facility].

## 2016-07-09 ENCOUNTER — Encounter: Payer: Medicare Other | Admitting: *Deleted

## 2016-07-09 DIAGNOSIS — Z952 Presence of prosthetic heart valve: Secondary | ICD-10-CM

## 2016-07-09 DIAGNOSIS — Z954 Presence of other heart-valve replacement: Secondary | ICD-10-CM | POA: Diagnosis not present

## 2016-07-09 NOTE — Progress Notes (Signed)
Daily Session Note  Patient Details  Name: Sheila Osborne MRN: [id number] Date of Birth: 1935/12/13 Referring Provider:   Flowsheet Row Cardiac Rehab from 06/21/2016 in [facility] Cardiac and Pulmonary Rehab  Referring Provider  Bartholome Bill MD      Encounter Date: 07/09/2016  Check In:     Session Check In - 07/09/16 0832      Check-In   Location [facility]-Cardiac & Pulmonary Rehab   Staff Present Heath Lark, RN, BSN, CCRP;Imara Standiford, RN, Levie Heritage, MA, ACSM RCEP, Exercise Physiologist   Supervising physician immediately available to respond to emergencies See telemetry face sheet for immediately available ER MD   Medication changes reported     No   Fall or balance concerns reported    No   Warm-up and Cool-down Performed on first and last piece of equipment   Resistance Training Performed Yes   VAD Patient? No     Pain Assessment   Currently in Pain? No/denies           Exercise Prescription Changes - 07/08/16 1000      Response to Exercise   Symptoms none   Comments Home Exercise Guidelines given 07/08/16   Duration Progress to 45 minutes of aerobic exercise without signs/symptoms of physical distress   Intensity THRR unchanged     Progression   Progression Continue to progress workloads to maintain intensity without signs/symptoms of physical distress.   Average METs 3.7     Resistance Training   Training Prescription Yes   Weight 3 lbs   Reps 10-15     Interval Training   Interval Training No     NuStep   Level 2   Minutes 15   METs 2.9     REL-XR   Level 2   Minutes 15   METs 4.5     Home Exercise Plan   Plans to continue exercise at Home  walking and yoga   Frequency Add 3 additional days to program exercise sessions.      Goals Met:  Proper associated with RPD/PD & O2 Sat Exercise tolerated well  Goals Unmet:  Not Applicable  Comments:     Dr. Emily Filbert is Medical Director for [place] and [facility].

## 2016-07-13 ENCOUNTER — Ambulatory Visit (INDEPENDENT_AMBULATORY_CARE_PROVIDER_SITE_OTHER): Payer: Medicare Other | Admitting: General Surgery

## 2016-07-13 ENCOUNTER — Encounter: Payer: Self-pay | Admitting: General Surgery

## 2016-07-13 ENCOUNTER — Inpatient Hospital Stay: Payer: Self-pay

## 2016-07-13 ENCOUNTER — Encounter: Payer: Medicare Other | Admitting: Respiratory Therapy

## 2016-07-13 VITALS — BP 130/76 | HR 77 | Resp 12 | Ht 63.0 in | Wt 135.0 lb

## 2016-07-13 DIAGNOSIS — Z952 Presence of prosthetic heart valve: Secondary | ICD-10-CM

## 2016-07-13 DIAGNOSIS — R928 Other abnormal and inconclusive findings on diagnostic imaging of breast: Secondary | ICD-10-CM | POA: Diagnosis not present

## 2016-07-13 DIAGNOSIS — Z954 Presence of other heart-valve replacement: Secondary | ICD-10-CM | POA: Diagnosis not present

## 2016-07-13 DIAGNOSIS — N6320 Unspecified lump in the left breast, unspecified quadrant: Secondary | ICD-10-CM

## 2016-07-13 DIAGNOSIS — Z1211 Encounter for screening for malignant neoplasm of colon: Secondary | ICD-10-CM

## 2016-07-13 DIAGNOSIS — Z1239 Encounter for other screening for malignant neoplasm of breast: Secondary | ICD-10-CM

## 2016-07-13 NOTE — Progress Notes (Signed)
Patient ID: Sheila Osborne, female   DOB: 1936/03/24, 81 y.o.   MRN: [id number]    HPI Sheila Osborne is a 81 y.o. female who presents for a breast evaluation. The most recent mammogram was done on 07/05/2016. Last colonoscopy was 2006. No GI problems at this time. aortic valve replacement done on 04/26/2016 at [facility].   The patient reports she has had no further episodes of syncope and has a markedly improved exercise tolerance. Patient does perform regular self breast checks and gets regular mammograms done.    HPI  Past Medical History:  Diagnosis Date  . Carotid bruit   . Hypertension   . Syncope 2012    Past Surgical History:  Procedure Laterality Date  . AORTIC VALVE REPLACEMENT  04/26/2016  . BREAST BIOPSY Left 1997   core- neg  . CARDIAC CATHETERIZATION Bilateral 02/13/2016   Procedure: Right/Left Heart Cath and Coronary Angiography;  Surgeon: Teodoro Spray, MD;  Location: [facility];  Service: Cardiovascular;  Laterality: Bilateral;  . CATARACT EXTRACTION  2014   x2  . COLONOSCOPY  2006    Family History  Problem Relation Age of Onset  . Cancer Father     stomach  . Breast cancer Neg Hx     Social History Social History  Substance Use Topics  . Smoking status: Never Smoker  . Smokeless tobacco: Never Used  . Alcohol use 0.0 oz/week     Comment: 1/day    No Known Allergies  Current Outpatient Prescriptions  Medication Sig Dispense Refill  . alendronate (FOSAMAX) 70 MG tablet Take 70 mg by mouth once a week.     Marland Kitchen amLODipine (NORVASC) 5 MG tablet Take 5 mg by mouth daily.    Marland Kitchen aspirin 325 MG tablet Take 325 mg by mouth daily.    . Ergocalciferol (VITAMIN D2 PO) Take 1.25 mg by mouth once a week.    . metoprolol tartrate (LOPRESSOR) 12.5 mg TABS tablet Take by mouth.    . vitamin C (ASCORBIC ACID) 500 MG tablet Take 500 mg by mouth daily.     No current facility-administered medications for this visit.     Review of  Systems Review of Systems  Constitutional: Negative.   Respiratory: Negative.   Cardiovascular: Negative.     Blood pressure 130/76, pulse 77, resp. rate 12, height 5\' 3"  (1.6 m), weight 135 lb (61.2 kg).  Physical Exam Physical Exam  Constitutional: She is oriented to person, place, and time. She appears well-developed and well-nourished.  Eyes: Conjunctivae are normal. No scleral icterus.  Neck: Neck supple.  Cardiovascular: Normal rate and regular rhythm.   Murmur heard.  Systolic murmur is present with a grade of 2/6  Pulmonary/Chest: Effort normal and breath sounds normal. Right breast exhibits no inverted nipple, no mass, no nipple discharge, no skin change and no tenderness. Left breast exhibits no inverted nipple, no mass, no nipple discharge, no skin change and no tenderness.    Abdominal: Soft. Bowel sounds are normal.  Lymphadenopathy:    She has no cervical adenopathy.  Neurological: She is alert and oriented to person, place, and time.  Skin: Skin is warm and dry.    Data Reviewed 07/05/2016 mammograms reviewed. Questionable new density in the upper-outer quadrant left breast.  Ultrasound examination of the upper-outer quadrant of the left breast showed no discernible area of abnormality. No areas of cystic or solid lesions. No evidence of architectural distortion. BI-RADS-2.  Assessment    Abnormal  mammogram.  Candidate for follow-up screening colonoscopy.    Plan    The patient will have additional images of the left breastcompleted in the near future [facility]. Follow up based on those studies.     Colonoscopy with possible biopsy/polypectomy prn: Information regarding the procedure, including its potential risks and complications (including but not limited to perforation of the bowel, which may require emergency surgery to repair, and bleeding) was verbally given to the patient. Educational information regarding lower intestinal endoscopy was  given to the patient. Written instructions for how to complete the bowel prep using Miralax were provided. The importance of drinking ample fluids to avoid dehydration as a result of the prep emphasized.  The patient has been asked to return to the office in one year with a bilateral screeningn mammogram.  This information has been scribed by Gaspar Cola CMA.   Robert Bellow 07/14/2016, 7:27 PM

## 2016-07-13 NOTE — Progress Notes (Signed)
Daily Session Note  Patient Details  Name: Sheila Osborne MRN: [id number] Date of Birth: 1935-12-27 Referring Provider:   Flowsheet Row Cardiac Rehab from 06/21/2016 in [facility] Cardiac and Pulmonary Rehab  Referring Provider  Bartholome Bill MD      Encounter Date: 07/13/2016  Check In:     Session Check In - 07/13/16 0939      Check-In   Location [facility]-Cardiac & Pulmonary Rehab   Staff Present Alberteen Sam, MA, ACSM RCEP, Exercise Physiologist;Laureen Owens Shark, BS, RRT, Respiratory Therapist;Carroll Enterkin, RN, BSN   Supervising physician immediately available to respond to emergencies See telemetry face sheet for immediately available ER MD   Medication changes reported     No   Fall or balance concerns reported    No   Warm-up and Cool-down Performed on first and last piece of equipment   Resistance Training Performed Yes   VAD Patient? No     Pain Assessment   Currently in Pain? No/denies   Multiple Pain Sites No         Goals Met:  Proper associated with RPD/PD & O2 Sat Independence with exercise equipment Exercise tolerated well No report of cardiac concerns or symptoms Strength training completed today  Goals Unmet:  Not Applicable  Comments: Pt able to follow exercise prescription today without complaint.  Will continue to monitor for progression.   Dr. Emily Filbert is Medical Director for [place] and [facility].

## 2016-07-13 NOTE — Patient Instructions (Signed)
Colonoscopy, Adult A colonoscopy is an exam to look at the entire large intestine. During the exam, a lubricated, bendable tube is inserted into the anus and then passed into the rectum, colon, and other parts of the large intestine. A colonoscopy is often done as a part of normal colorectal screening or in response to certain symptoms, such as anemia, persistent diarrhea, abdominal pain, and blood in the stool. The exam can help screen for and diagnose medical problems, including:  Tumors.  Polyps.  Inflammation.  Areas of bleeding. Tell a health care provider about:  Any allergies you have.  All medicines you are taking, including vitamins, herbs, eye drops, creams, and over-the-counter medicines.  Any problems you or family members have had with anesthetic medicines.  Any blood disorders you have.  Any surgeries you have had.  Any medical conditions you have.  Any problems you have had passing stool. What are the risks? Generally, this is a safe procedure. However, problems may occur, including:  Bleeding.  A tear in the intestine.  A reaction to medicines given during the exam.  Infection (rare). What happens before the procedure? Eating and drinking restrictions  Follow instructions from your health care provider about eating and drinking, which may include:  A few days before the procedure - follow a low-fiber diet. Avoid nuts, seeds, dried fruit, raw fruits, and vegetables.  1-3 days before the procedure - follow a clear liquid diet. Drink only clear liquids, such as clear broth or bouillon, black coffee or tea, clear juice, clear soft drinks or sports drinks, gelatin desert, and popsicles. Avoid any liquids that contain red or purple dye.  On the day of the procedure - do not eat or drink anything during the 2 hours before the procedure, or within the time period that your health care provider recommends. Bowel prep  If you were prescribed an oral bowel prep to  clean out your colon:  Take it as told by your health care provider. Starting the day before your procedure, you will need to drink a large amount of medicated liquid. The liquid will cause you to have multiple loose stools until your stool is almost clear or light green.  If your skin or anus gets irritated from diarrhea, you may use these to relieve the irritation:  Medicated wipes, such as adult wet wipes with aloe and vitamin E.  A skin soothing-product like petroleum jelly.  If you vomit while drinking the bowel prep, take a break for up to 60 minutes and then begin the bowel prep again. If vomiting continues and you cannot take the bowel prep without vomiting, call your health care provider. General instructions  Ask your health care provider about changing or stopping your regular medicines. This is especially important if you are taking diabetes medicines or blood thinners.  Plan to have someone take you home from the hospital or clinic. What happens during the procedure?  An IV tube may be inserted into one of your veins.  You will be given medicine to help you relax (sedative).  To reduce your risk of infection:  Your health care team will wash or sanitize their hands.  Your anal area will be washed with soap.  You will be asked to lie on your side with your knees bent.  Your health care provider will lubricate a long, thin, flexible tube. The tube will have a camera and a light on the end.  The tube will be inserted into your anus.  The tube will be gently eased through your rectum and colon.  Air will be delivered into your colon to keep it open. You may feel some pressure or cramping.  The camera will be used to take images during the procedure.  A small tissue sample may be removed from your body to be examined under a microscope (biopsy). If any potential problems are found, the tissue will be sent to a lab for testing.  If small polyps are found, your health  care provider may remove them and have them checked for cancer cells.  The tube that was inserted into your anus will be slowly removed. The procedure may vary among health care providers and hospitals. What happens after the procedure?  Your blood pressure, heart rate, breathing rate, and blood oxygen level will be monitored until the medicines you were given have worn off.  Do not drive for 24 hours after the exam.  You may have a small amount of blood in your stool.  You may pass gas and have mild abdominal cramping or bloating due to the air that was used to inflate your colon during the exam.  It is up to you to get the results of your procedure. Ask your health care provider, or the department performing the procedure, when your results will be ready. This information is not intended to replace advice given to you by your health care provider. Make sure you discuss any questions you have with your health care provider. Document Released: 05/21/2000 Document Revised: 12/12/2015 Document Reviewed: 08/05/2015 Elsevier Interactive Patient Education  2017 [employer]vier Inc[employer]  

## 2016-07-14 DIAGNOSIS — R928 Other abnormal and inconclusive findings on diagnostic imaging of breast: Secondary | ICD-10-CM | POA: Insufficient documentation

## 2016-07-15 ENCOUNTER — Other Ambulatory Visit: Payer: Self-pay | Admitting: *Deleted

## 2016-07-15 ENCOUNTER — Encounter: Payer: Self-pay | Admitting: *Deleted

## 2016-07-15 DIAGNOSIS — Z954 Presence of other heart-valve replacement: Secondary | ICD-10-CM | POA: Diagnosis not present

## 2016-07-15 DIAGNOSIS — Z952 Presence of prosthetic heart valve: Secondary | ICD-10-CM

## 2016-07-15 MED ORDER — POLYETHYLENE GLYCOL 3350 17 GM/SCOOP PO POWD: ORAL | 0 refills | Status: DC

## 2016-07-15 NOTE — Progress Notes (Signed)
Patient has been scheduled for a colonoscopy on 08-25-16 at [facility].   This patient has been asked to decrease current 325 mg aspirin to 81 mg aspirin starting one week prior to procedure.

## 2016-07-15 NOTE — Progress Notes (Signed)
Daily Session Note  Patient Details  Name: Sheila Osborne MRN: [id number] Date of Birth: 1936-05-21 Referring Provider:   Flowsheet Row Cardiac Rehab from 06/21/2016 in [facility] Cardiac and Pulmonary Rehab  Referring Provider  Bartholome Bill MD      Encounter Date: 07/15/2016  Check In:     Session Check In - 07/15/16 0846      Check-In   Location [facility]-Cardiac & Pulmonary Rehab   Staff Present Alberteen Sam, MA, ACSM RCEP, Exercise Physiologist;Patricia Surles RN Vickki Hearing, BA, ACSM CEP, Exercise Physiologist   Supervising physician immediately available to respond to emergencies See telemetry face sheet for immediately available ER MD   Medication changes reported     No   Fall or balance concerns reported    No   Warm-up and Cool-down Performed on first and last piece of equipment   Resistance Training Performed Yes   VAD Patient? No     Pain Assessment   Currently in Pain? No/denies   Multiple Pain Sites No           Exercise Prescription Changes - 07/14/16 1500      Exercise Review   Progression Yes     Response to Exercise   Blood Pressure (Admit) 146/74   Blood Pressure (Exercise) 150/88   Blood Pressure (Exit) 132/76   Heart Rate (Admit) 73 bpm   Heart Rate (Exercise) 113 bpm   Heart Rate (Exit) 72 bpm   Rating of Perceived Exertion (Exercise) 13   Symptoms none   Comments Home Exercise Guidelines given 07/08/16   Duration Progress to 45 minutes of aerobic exercise without signs/symptoms of physical distress   Intensity THRR unchanged     Progression   Progression Continue to progress workloads to maintain intensity without signs/symptoms of physical distress.   Average METs 4.34     Resistance Training   Training Prescription Yes   Weight 3 lbs   Reps 10-15     Interval Training   Interval Training No     Treadmill   MPH 3   Grade 1   Minutes 15   METs 3.71     NuStep   Level 4   Minutes 15   METs 3.9     REL-XR   Level  4   Minutes 15   METs 5.4     Home Exercise Plan   Plans to continue exercise at Home  walking and yoga   Frequency Add 3 additional days to program exercise sessions.      Goals Met:  Independence with exercise equipment Exercise tolerated well No report of cardiac concerns or symptoms Strength training completed today  Goals Unmet:  Not Applicable  Comments: Pt able to follow exercise prescription today without complaint.  Will continue to monitor for progression.    Dr. Emily Filbert is Medical Director for [place] and [facility].

## 2016-07-16 ENCOUNTER — Encounter: Payer: Medicare Other | Admitting: *Deleted

## 2016-07-16 DIAGNOSIS — Z954 Presence of other heart-valve replacement: Secondary | ICD-10-CM | POA: Diagnosis not present

## 2016-07-16 DIAGNOSIS — Z952 Presence of prosthetic heart valve: Secondary | ICD-10-CM

## 2016-07-16 NOTE — Progress Notes (Signed)
Daily Session Note  Patient Details  Name: Eboni Coval MRN: [id number] Date of Birth: 05-01-1936 Referring Provider:   Flowsheet Row Cardiac Rehab from 06/21/2016 in [facility] Cardiac and Pulmonary Rehab  Referring Provider  Bartholome Bill MD      Encounter Date: 07/16/2016  Check In:     Session Check In - 07/16/16 0853      Check-In   Staff Present Gerlene Burdock, RN, BSN;Aivan Fillingim, RN, BSN, CCRP;Jessica Luan Pulling, MA, ACSM RCEP, Exercise Physiologist   Supervising physician immediately available to respond to emergencies See telemetry face sheet for immediately available ER MD   Medication changes reported     No   Fall or balance concerns reported    No   Warm-up and Cool-down Performed on first and last piece of equipment   Resistance Training Performed Yes   VAD Patient? No     Pain Assessment   Currently in Pain? No/denies         Goals Met:  Independence with exercise equipment Exercise tolerated well Personal goals reviewed No report of cardiac concerns or symptoms Strength training completed today  Goals Unmet:  Not Applicable  Comments: Doing well with exercise prescription progression.    Dr. Emily Filbert is Medical Director for [place] and [facility].

## 2016-07-20 ENCOUNTER — Encounter: Payer: Medicare Other | Admitting: Respiratory Therapy

## 2016-07-20 ENCOUNTER — Ambulatory Visit
Admission: RE | Admit: 2016-07-20 | Discharge: 2016-07-20 | Disposition: A | Discharge status: Home / Self Care | Payer: Medicare Other | Source: Ambulatory Visit | Attending: General Surgery | Admitting: General Surgery

## 2016-07-20 DIAGNOSIS — N6320 Unspecified lump in the left breast, unspecified quadrant: Secondary | ICD-10-CM

## 2016-07-20 DIAGNOSIS — R928 Other abnormal and inconclusive findings on diagnostic imaging of breast: Secondary | ICD-10-CM

## 2016-07-20 DIAGNOSIS — Z954 Presence of other heart-valve replacement: Secondary | ICD-10-CM | POA: Diagnosis not present

## 2016-07-20 DIAGNOSIS — Z952 Presence of prosthetic heart valve: Secondary | ICD-10-CM

## 2016-07-20 NOTE — Progress Notes (Signed)
Daily Session Note  Patient Details  Name: Sheila Osborne MRN: [id number] Date of Birth: 12-30-1935 Referring Provider:   Flowsheet Row Cardiac Rehab from 06/21/2016 in [facility] Cardiac and Pulmonary Rehab  Referring Provider  Bartholome Bill MD      Encounter Date: 07/20/2016  Check In:     Session Check In - 07/20/16 0855      Check-In   Staff Present Alberteen Sam, MA, ACSM RCEP, Exercise Physiologist;Laureen Owens Shark, BS, RRT, Respiratory Therapist;Carroll Enterkin, RN, BSN   Supervising physician immediately available to respond to emergencies See telemetry face sheet for immediately available ER MD   Medication changes reported     No   Fall or balance concerns reported    No   Warm-up and Cool-down Performed on first and last piece of equipment   Resistance Training Performed Yes   VAD Patient? No     Pain Assessment   Currently in Pain? No/denies   Multiple Pain Sites No         Goals Met:  Proper associated with RPD/PD & O2 Sat Independence with exercise equipment No report of cardiac concerns or symptoms Strength training completed today  Goals Unmet:  Not Applicable  Comments: Pt able to follow exercise prescription today without complaint.  Will continue to monitor for progression.   Dr. Emily Filbert is Medical Director for [place] and [facility].

## 2016-07-22 DIAGNOSIS — Z952 Presence of prosthetic heart valve: Secondary | ICD-10-CM

## 2016-07-22 DIAGNOSIS — Z954 Presence of other heart-valve replacement: Secondary | ICD-10-CM | POA: Diagnosis not present

## 2016-07-22 NOTE — Progress Notes (Signed)
Daily Session Note  Patient Details  Name: Sheila Osborne MRN: [id number] Date of Birth: [birth year] Referring Provider:   Flowsheet Row Cardiac Rehab from 06/21/2016 in [facility] Cardiac and Pulmonary Rehab  Referring Provider  Bartholome Bill MD      Encounter Date: 07/22/2016  Check In:     Session Check In - 07/22/16 0910      Check-In   Location [facility]-Cardiac & Pulmonary Rehab   Staff Present Alberteen Sam, MA, ACSM RCEP, Exercise Physiologist;Patricia Surles RN Vickki Hearing, BA, ACSM CEP, Exercise Physiologist   Supervising physician immediately available to respond to emergencies See telemetry face sheet for immediately available ER MD   Medication changes reported     No   Fall or balance concerns reported    No   Warm-up and Cool-down Performed on first and last piece of equipment   Resistance Training Performed Yes   VAD Patient? No     Pain Assessment   Currently in Pain? No/denies         Goals Met:  Independence with exercise equipment Exercise tolerated well No report of cardiac concerns or symptoms Strength training completed today  Goals Unmet:  Not Applicable  Comments: Pt able to follow exercise prescription today without complaint.  Will continue to monitor for progression.    Dr. Emily Filbert is Medical Director for [place] and [facility].

## 2016-07-23 ENCOUNTER — Encounter: Payer: Medicare Other | Admitting: *Deleted

## 2016-07-23 DIAGNOSIS — Z954 Presence of other heart-valve replacement: Secondary | ICD-10-CM | POA: Diagnosis not present

## 2016-07-23 DIAGNOSIS — Z952 Presence of prosthetic heart valve: Secondary | ICD-10-CM

## 2016-07-23 NOTE — Progress Notes (Signed)
Daily Session Note  Patient Details  Name: Sheila Osborne MRN: [id number] Date of Birth: 1935-08-27 Referring Provider:   Flowsheet Row Cardiac Rehab from 06/21/2016 in [facility] Cardiac and Pulmonary Rehab  Referring Provider  Bartholome Bill MD      Encounter Date: 07/23/2016  Check In:     Session Check In - 07/23/16 0841      Check-In   Staff Present Heath Lark, RN, BSN, CCRP;Jessica Luan Pulling, MA, ACSM RCEP, Exercise Physiologist;Carroll Enterkin, RN, BSN   Supervising physician immediately available to respond to emergencies See telemetry face sheet for immediately available ER MD   Medication changes reported     No   Fall or balance concerns reported    No   Warm-up and Cool-down Performed on first and last piece of equipment   Resistance Training Performed Yes   VAD Patient? No     Pain Assessment   Currently in Pain? No/denies         Goals Met:  Exercise tolerated well No report of cardiac concerns or symptoms Strength training completed today  Goals Unmet:  Not Applicable  Comments: Doing well with exercise prescription progression.    Dr. Emily Filbert is Medical Director for [place] and [facility].

## 2016-07-27 ENCOUNTER — Encounter: Payer: Medicare Other | Admitting: Respiratory Therapy

## 2016-07-27 DIAGNOSIS — Z954 Presence of other heart-valve replacement: Secondary | ICD-10-CM | POA: Diagnosis not present

## 2016-07-27 DIAGNOSIS — Z952 Presence of prosthetic heart valve: Secondary | ICD-10-CM

## 2016-07-27 NOTE — Progress Notes (Signed)
Daily Session Note  Patient Details  Name: Sheila Osborne MRN: [id number] Date of Birth: 05-14-1936 Referring Provider:   Flowsheet Row Cardiac Rehab from 06/21/2016 in [facility] Cardiac and Pulmonary Rehab  Referring Provider  Bartholome Bill MD      Encounter Date: 07/27/2016  Check In:     Session Check In - 07/27/16 0836      Check-In   Location [facility]-Cardiac & Pulmonary Rehab   Staff Present Heath Lark, RN, BSN, CCRP;Laureen Owens Shark, BS, RRT, Respiratory Therapist;Carroll Enterkin, RN, BSN   Supervising physician immediately available to respond to emergencies See telemetry face sheet for immediately available ER MD   Medication changes reported     No   Fall or balance concerns reported    No   Warm-up and Cool-down Performed on first and last piece of equipment   Resistance Training Performed Yes   VAD Patient? No     Pain Assessment   Currently in Pain? No/denies   Multiple Pain Sites No         Goals Met:  Proper associated with RPD/PD & O2 Sat Independence with exercise equipment Exercise tolerated well No report of cardiac concerns or symptoms Strength training completed today  Goals Unmet:  Not Applicable  Comments:  Pt able to follow exercise prescription today without complaint.  Will continue to monitor for progression.   Dr. Emily Filbert is Medical Director for [place] and [facility].

## 2016-07-29 ENCOUNTER — Encounter: Payer: Medicare Other | Admitting: *Deleted

## 2016-07-29 DIAGNOSIS — Z954 Presence of other heart-valve replacement: Secondary | ICD-10-CM | POA: Diagnosis not present

## 2016-07-29 DIAGNOSIS — Z952 Presence of prosthetic heart valve: Secondary | ICD-10-CM

## 2016-07-29 NOTE — Progress Notes (Signed)
Daily Session Note  Patient Details  Name: Sheila Osborne MRN: [id number] Date of Birth: 1936/04/03 Referring Provider:   Flowsheet Row Cardiac Rehab from 06/21/2016 in [facility]  Referring Provider  Bartholome Bill MD      Encounter Date: 07/29/2016  Check In:     Session Check In - 07/29/16 1036      Check-In   Location [facility]-Cardiac & Pulmonary Rehab   Staff Present Alberteen Sam, MA, ACSM RCEP, Exercise Physiologist;Other;Amanda Oletta Darter, BA, ACSM CEP, Exercise Physiologist  Darel Hong, RN   Supervising physician immediately available to respond to emergencies See telemetry face sheet for immediately available ER MD   Medication changes reported     No   Fall or balance concerns reported    No   Warm-up and Cool-down Performed on first and last piece of equipment   Resistance Training Performed Yes   VAD Patient? No     Pain Assessment   Currently in Pain? No/denies   Multiple Pain Sites No           Exercise Prescription Changes - 07/28/16 1500      Exercise Review   Progression Yes     Response to Exercise   Blood Pressure (Admit) 118/70   Blood Pressure (Exercise) 146/80   Blood Pressure (Exit) 126/68   Heart Rate (Admit) 60 bpm   Heart Rate (Exercise) 133 bpm   Heart Rate (Exit) 66 bpm   Rating of Perceived Exertion (Exercise) 13   Symptoms none   Comments Home Exercise Guidelines given 07/08/16   Duration Progress to 45 minutes of aerobic exercise without signs/symptoms of physical distress   Intensity THRR unchanged     Progression   Progression Continue to progress workloads to maintain intensity without signs/symptoms of physical distress.   Average METs 5.16     Resistance Training   Training Prescription Yes   Weight 3 lbs   Reps 10-15     Interval Training   Interval Training No     Treadmill   MPH 3.2   Grade 3   Minutes 15   METs 4.77     NuStep   Level 4   Minutes 15   METs 4.5     REL-XR    Level 4   Minutes 15   METs 6.2     Home Exercise Plan   Plans to continue exercise at Home  walking and yoga   Frequency Add 3 additional days to program exercise sessions.      Goals Met:  Independence with exercise equipment Exercise tolerated well No report of cardiac concerns or symptoms Strength training completed today  Goals Unmet:  Not Applicable  Comments: Pt able to follow exercise prescription today without complaint.  Will continue to monitor for progression.    Dr. Emily Filbert is Medical Director for [place] and [facility].

## 2016-07-30 DIAGNOSIS — Z954 Presence of other heart-valve replacement: Secondary | ICD-10-CM | POA: Diagnosis not present

## 2016-07-30 DIAGNOSIS — Z952 Presence of prosthetic heart valve: Secondary | ICD-10-CM

## 2016-07-30 NOTE — Progress Notes (Signed)
Daily Session Note  Patient Details  Name: Sheila Osborne MRN: [id number] Date of Birth: 1935-11-01 Referring Provider:   Flowsheet Row Cardiac Rehab from 06/21/2016 in [facility] Cardiac [facility]  Referring Provider  Bartholome Bill MD      Encounter Date: 07/30/2016  Check In:     Session Check In - 07/30/16 0811      Check-In   Location [facility]-Cardiac & Pulmonary Rehab   Staff Present Alberteen Sam, MA, ACSM RCEP, Exercise Physiologist;Susanne Bice, RN, BSN, Lance Sell, BA, ACSM CEP, Exercise Physiologist   Supervising physician immediately available to respond to emergencies See telemetry face sheet for immediately available ER MD   Medication changes reported     No   Fall or balance concerns reported    No   Warm-up and Cool-down Performed on first and last piece of equipment   Resistance Training Performed Yes   VAD Patient? No     Pain Assessment   Currently in Pain? No/denies         Goals Met:  Independence with exercise equipment Exercise tolerated well No report of cardiac concerns or symptoms Strength training completed today  Goals Unmet:  Not Applicable  Comments: Pt able to follow exercise prescription today without complaint.  Will continue to monitor for progression.    Dr. Emily Filbert is Medical Director for [place] [facility].

## 2016-08-03 ENCOUNTER — Encounter: Payer: Medicare Other | Admitting: Respiratory Therapy

## 2016-08-03 DIAGNOSIS — Z954 Presence of other heart-valve replacement: Secondary | ICD-10-CM | POA: Diagnosis not present

## 2016-08-03 DIAGNOSIS — Z952 Presence of prosthetic heart valve: Secondary | ICD-10-CM

## 2016-08-03 NOTE — Progress Notes (Signed)
Daily Session Note  Patient Details  Name: Sheila Osborne MRN: [id number] Date of Birth: Nov 03, 1935 Referring Provider:   Flowsheet Row Cardiac Rehab from 06/21/2016 in [facility] Cardiac and Pulmonary Rehab  Referring Provider  Bartholome Bill MD      Encounter Date: 08/03/2016  Check In:     Session Check In - 08/03/16 0825      Check-In   Location [facility]-Cardiac & Pulmonary Rehab   Staff Present Alberteen Sam, MA, ACSM RCEP, Exercise Physiologist;Susanne Bice, RN, BSN, CCRP;Laureen Owens Shark, BS, RRT, Respiratory Therapist   Supervising physician immediately available to respond to emergencies See telemetry face sheet for immediately available ER MD   Medication changes reported     No   Fall or balance concerns reported    No   Warm-up and Cool-down Performed on first and last piece of equipment   Resistance Training Performed Yes   VAD Patient? No     Pain Assessment   Currently in Pain? No/denies   Multiple Pain Sites No         History  Smoking Status   Never Smoker  Smokeless Tobacco   Never Used    Goals Met:  Proper associated with RPD/PD & O2 Sat Independence with exercise equipment Exercise tolerated well No report of cardiac concerns or symptoms Strength training completed today  Goals Unmet:  Not Applicable  Comments: Pt able to follow exercise prescription today without complaint.  Will continue to monitor for progression.   Dr. Emily Filbert is Medical Director for [place] and [facility].

## 2016-08-04 ENCOUNTER — Encounter: Payer: Self-pay | Admitting: *Deleted

## 2016-08-04 DIAGNOSIS — Z952 Presence of prosthetic heart valve: Secondary | ICD-10-CM

## 2016-08-04 NOTE — Progress Notes (Signed)
Cardiac Individual Treatment Plan  Patient Details  Name: Sheila Osborne MRN: [id number] Date of Birth: 10-Jan-1936 Referring Provider:   Flowsheet Row Cardiac Rehab from 06/21/2016 in [facility] Cardiac and Pulmonary Rehab  Referring Provider  Bartholome Bill MD      Initial Encounter Date:  Flowsheet Row Cardiac Rehab from 06/21/2016 in [facility] Cardiac and Pulmonary Rehab  Date  06/21/16  Referring Provider  Bartholome Bill MD      Visit Diagnosis: S/P TAVR (transcatheter aortic valve replacement)  Patient's Home Medications on Admission:  Current Outpatient Prescriptions:  .  alendronate (FOSAMAX) 70 MG tablet, Take 70 mg by mouth once a week. , Disp: , Rfl:  .  amLODipine (NORVASC) 5 MG tablet, Take 5 mg by mouth daily., Disp: , Rfl:  .  aspirin 325 MG tablet, Take 325 mg by mouth daily., Disp: , Rfl:  .  Ergocalciferol (VITAMIN D2 PO), Take 1.25 mg by mouth once a week., Disp: , Rfl:  .  metoprolol tartrate (LOPRESSOR) 12.5 mg TABS tablet, Take by mouth., Disp: , Rfl:  .  polyethylene glycol powder (GLYCOLAX/MIRALAX) powder, 255 grams one bottle for colonoscopy prep, Disp: 255 g, Rfl: 0 .  vitamin C (ASCORBIC ACID) 500 MG tablet, Take 500 mg by mouth daily., Disp: , Rfl:   Past Medical History: Past Medical History:  Diagnosis Date  . Carotid bruit   . Hypertension   . Syncope 2012    Tobacco Use: History  Smoking Status  . Never Smoker  Smokeless Tobacco  . Never Used    Labs: Recent Review Flowsheet Data    There is no flowsheet data to display.       Exercise Target Goals:    Exercise Program Goal: Individual exercise prescription set with THRR, safety & activity barriers. Participant demonstrates ability to understand and report RPE using BORG scale, to self-measure pulse accurately, and to acknowledge the importance of the exercise prescription.  Exercise Prescription Goal: Starting with aerobic activity 30 plus minutes a day, 3 days per week for  initial exercise prescription. Provide home exercise prescription and guidelines that participant acknowledges understanding prior to discharge.  Activity Barriers & Risk Stratification:     Activity Barriers & Cardiac Risk Stratification - 06/21/16 1131      Activity Barriers & Cardiac Risk Stratification   Activity Barriers Arthritis   Cardiac Risk Stratification High      6 Minute Walk:     6 Minute Walk    Row Name 06/21/16 1620         6 Minute Walk   Phase Initial     Distance 1600 feet     Walk Time 6 minutes     # of Rest Breaks 0     MPH 3.03     METS 3.14     RPE 12     VO2 Peak 11.01     Symptoms No     Resting HR 71 bpm     Resting BP 124/64     Max Ex. HR 94 bpm     Max Ex. BP 128/64     2 Minute Post BP 122/60        Oxygen Initial Assessment:   Oxygen Re-Evaluation:   Oxygen Discharge (Final Oxygen Re-Evaluation):   Initial Exercise Prescription:     Initial Exercise Prescription - 06/21/16 1600      Date of Initial Exercise RX and Referring Provider   Date 06/21/16   Referring Provider Bartholome Bill  MD     Treadmill   MPH 2.8   Grade 0   Minutes 15   METs 3.14     NuStep   Level 2   Minutes 15   METs 2     Recumbant Elliptical   Level 1   RPM 50   Minutes 15   METs 2     Prescription Details   Frequency (times per week) 3   Duration Progress to 45 minutes of aerobic exercise without signs/symptoms of physical distress     Intensity   THRR 40-80% of Max Heartrate 99-128   Ratings of Perceived Exertion 11-15   Perceived Dyspnea 0-4     Progression   Progression Continue to progress workloads to maintain intensity without signs/symptoms of physical distress.     Resistance Training   Training Prescription Yes   Weight 3 lbs   Reps 10-15      Perform Capillary Blood Glucose checks as needed.  Exercise Prescription Changes:     Exercise Prescription Changes    Row Name 06/21/16 1400 06/29/16 1400 07/08/16  1000 07/14/16 1500 07/28/16 1500     Response to Exercise   Blood Pressure (Admit) 124/64 148/90  - 146/74 118/70   Blood Pressure (Exercise) 128/64 158/84  - 150/88 146/80   Blood Pressure (Exit) 122/60 146/80  - 132/76 126/68   Heart Rate (Admit) 71 bpm 69 bpm  - 73 bpm 60 bpm   Heart Rate (Exercise) 94 bpm 105 bpm  - 113 bpm 133 bpm   Heart Rate (Exit) 78 bpm 67 bpm  - 72 bpm 66 bpm   Oxygen Saturation (Admit) 99 %  -  -  -  -   Oxygen Saturation (Exercise) 99 %  -  -  -  -   Rating of Perceived Exertion (Exercise) 12 13  - 13 13   Symptoms  - none none none none   Comments  -  - Home Exercise Guidelines given 07/08/16 Home Exercise Guidelines given 07/08/16 Home Exercise Guidelines given 07/08/16   Duration  - Progress to 45 minutes of aerobic exercise without signs/symptoms of physical distress Progress to 45 minutes of aerobic exercise without signs/symptoms of physical distress Progress to 45 minutes of aerobic exercise without signs/symptoms of physical distress Progress to 45 minutes of aerobic exercise without signs/symptoms of physical distress   Intensity  - THRR unchanged THRR unchanged THRR unchanged THRR unchanged     Progression   Progression  - Continue to progress workloads to maintain intensity without signs/symptoms of physical distress. Continue to progress workloads to maintain intensity without signs/symptoms of physical distress. Continue to progress workloads to maintain intensity without signs/symptoms of physical distress. Continue to progress workloads to maintain intensity without signs/symptoms of physical distress.   Average METs  - 3.7 3.7 4.34 5.16     Resistance Training   Training Prescription  - Yes Yes Yes Yes   Weight  - 3 lbs 3 lbs 3 lbs 3 lbs   Reps  - 10-15 10-15 10-15 10-15     Interval Training   Interval Training  - No No No No     Treadmill   MPH  -  -  - 3 3.2   Grade  -  -  - 1 3   Minutes  -  -  - 15 15   METs  -  -  - 3.71 4.77     NuStep    Level  -  2 2 4 4    Minutes  - 15 15 15 15    METs  - 2.9 2.9 3.9 4.5     REL-XR   Level  - 2 2 4 4    Minutes  - 15 15 15 15    METs  - 4.5 4.5 5.4 6.2     Home Exercise Plan   Plans to continue exercise at  -  - Home  walking and yoga Home  walking and yoga Home  walking and yoga   Frequency  -  - Add 3 additional days to program exercise sessions. Add 3 additional days to program exercise sessions. Add 3 additional days to program exercise sessions.     Exercise Review   Progression -  walk test results -  First Full Day of Exercise  - Yes Yes      Exercise Comments:     Exercise Comments    Row Name 06/29/16 1028 07/08/16 1016 07/14/16 1523 07/28/16 1527 07/29/16 1437   Exercise Comments First full day of exercise!  Patient was oriented to gym and equipment including functions, settings, policies, and procedures.  Patient's individual exercise prescription and treatment plan were reviewed.  All starting workloads were established based on the results of the 6 minute walk test done at initial orientation visit.  The plan for exercise progression was also introduced and progression will be customized based on patient's performance and goals. Reviewed home exercise with pt today.  Pt plans to walk and do yoga at home for exercise.  Angie was encouraged to try to attend a yoga class in person to make sure she is using proper form and technique.  Reviewed THR, pulse, RPE, sign and symptoms, NTG use, and when to call 911 or MD.  Also discussed weather considerations and indoor options.  Pt voiced understanding. Angie has been doing well in rehab.  She is now up to 5.1 METs on the XR.  We will continue to monitor her progress. Angie continues to do well in rehab.  She is consistently getting over 4 METs on the NuStep.  We will continue to monitor her progression. Angie started intervals today on the XR and NuStep!!      Exercise Goals and Review:   Exercise Goals Re-Evaluation  :   Discharge Exercise Prescription (Final Exercise Prescription Changes):     Exercise Prescription Changes - 07/28/16 1500      Response to Exercise   Blood Pressure (Admit) 118/70   Blood Pressure (Exercise) 146/80   Blood Pressure (Exit) 126/68   Heart Rate (Admit) 60 bpm   Heart Rate (Exercise) 133 bpm   Heart Rate (Exit) 66 bpm   Rating of Perceived Exertion (Exercise) 13   Symptoms none   Comments Home Exercise Guidelines given 07/08/16   Duration Progress to 45 minutes of aerobic exercise without signs/symptoms of physical distress   Intensity THRR unchanged     Progression   Progression Continue to progress workloads to maintain intensity without signs/symptoms of physical distress.   Average METs 5.16     Resistance Training   Training Prescription Yes   Weight 3 lbs   Reps 10-15     Interval Training   Interval Training No     Treadmill   MPH 3.2   Grade 3   Minutes 15   METs 4.77     NuStep   Level 4   Minutes 15   METs 4.5     REL-XR   Level  4   Minutes 15   METs 6.2     Home Exercise Plan   Plans to continue exercise at Home  walking and yoga   Frequency Add 3 additional days to program exercise sessions.     Exercise Review   Progression Yes      Nutrition:  Target Goals: Understanding of nutrition guidelines, daily intake of sodium 1500mg , cholesterol 200mg , calories 30% from fat and 7% or less from saturated fats, daily to have 5 or more servings of fruits and vegetables.  Biometrics:     Pre Biometrics - 06/21/16 1622      Pre Biometrics   Height 5' 4.8" (1.646 m)   Weight 133 lb (60.3 kg)   Waist Circumference 28 inches   Hip Circumference 36 inches   Waist to Hip Ratio 0.78 %   BMI (Calculated) 22.3   Single Leg Stand 30 seconds       Nutrition Therapy Plan and Nutrition Goals:     Nutrition Therapy & Goals - 07/16/16 0816      Nutrition Therapy   Diet DeferredRDAppointment      Nutrition Discharge: Rate Your  Plate Scores:     Nutrition Assessments - 06/21/16 1453      MEDFICTS Scores   Pre Score 33      Nutrition Goals Re-Evaluation:   Nutrition Goals Discharge (Final Nutrition Goals Re-Evaluation):   Psychosocial: Target Goals: Acknowledge presence or absence of significant depression and/or stress, maximize coping skills, provide positive support system. Participant is able to verbalize types and ability to use techniques and skills needed for reducing stress and depression.   Initial Review & Psychosocial Screening:     Initial Psych Review & Screening - 06/21/16 1457      Initial Review   Current issues with --  None reported     Family Dynamics   Good Support System? Yes     Barriers   Psychosocial barriers to participate in program There are no identifiable barriers or psychosocial needs.;The patient should benefit from training in stress management and relaxation.     Screening Interventions   Interventions Encouraged to exercise      Quality of Life Scores:      Quality of Life - 06/21/16 1100      Quality of Life Scores   Health/Function Pre 24.37 %   Socioeconomic Pre 25.5 %   Psych/Spiritual Pre 24 %   Family Pre 30 %   GLOBAL Pre 25.22 %      PHQ-9: Recent Review Flowsheet Data    Depression screen [facility] 2/9 06/21/2016   Decreased Interest 0   Down, Depressed, Hopeless 0   PHQ - 2 Score 0   Altered sleeping 0   Tired, decreased energy 0   Change in appetite 0   Feeling bad or failure about yourself  0   Trouble concentrating 0   Moving slowly or fidgety/restless 0   Suicidal thoughts 0   PHQ-9 Score 0     Interpretation of Total Score  Total Score Depression Severity:  1-4 = Minimal depression, 5-9 = Mild depression, 10-14 = Moderate depression, 15-19 = Moderately severe depression, 20-27 = Severe depression   Psychosocial Evaluation and Intervention:     Psychosocial Evaluation - 07/16/16 0854      Psychosocial Evaluation &  Interventions   Comments Spoke with Slyvia today.  She stated her stress is more about her blood pressure reading. She has had a few reading at home  that are well above the norm. Her next appointment with her physician is in March.  Advised her to call her physician and review her BP readings and her concern of the stress she is experiencing because of  the BP readings. She has not needed to take any anxiety meds since she was in the hospital.     Will follow up with Lashaunda to see if she spoke with her physycian about her concerns.  Goal is stress reduction and better blood pressure contol      Psychosocial Re-Evaluation:     Psychosocial Re-Evaluation    Row Name 07/22/16 0932             Psychosocial Re-Evaluation   Comments Follow up with Angie today reporting her blood pressure seemed related to her increased anxiety symptoms, so her Dr. Ricard Dillon her Blood pressure medication this past Monday 2/12.  She is hoping this will help - although last night her BP was up again; which she states just "wears me out."  She reports otherwise this program has been helpful and she particularly enjoyed the stress management education recently.  Staff will continue to follow with Angie concerning these matters.          Psychosocial Discharge (Final Psychosocial Re-Evaluation):     Psychosocial Re-Evaluation - 07/22/16 0932      Psychosocial Re-Evaluation   Comments Follow up with Angie today reporting her blood pressure seemed related to her increased anxiety symptoms, so her Dr. Ricard Dillon her Blood pressure medication this past Monday 2/12.  She is hoping this will help - although last night her BP was up again; which she states just "wears me out."  She reports otherwise this program has been helpful and she particularly enjoyed the stress management education recently.  Staff will continue to follow with Angie concerning these matters.      Vocational Rehabilitation: Provide vocational rehab  assistance to qualifying candidates.   Vocational Rehab Evaluation & Intervention:     Vocational Rehab - 06/21/16 1459      Initial Vocational Rehab Evaluation & Intervention   Assessment shows need for Vocational Rehabilitation No      Education: Education Goals: Education classes will be provided on a weekly basis, covering required topics. Participant will state understanding/return demonstration of topics presented.  Learning Barriers/Preferences:   Education Topics: General Nutrition Guidelines/Fats and Fiber: -Group instruction provided by verbal, written material, models and posters to present the general guidelines for heart healthy nutrition. Gives an explanation and review of dietary fats and fiber.   Controlling Sodium/Reading Food Labels: -Group verbal and written material supporting the discussion of sodium use in heart healthy nutrition. Review and explanation with models, verbal and written materials for utilization of the food label.   Exercise Physiology & Risk Factors: - Group verbal and written instruction with models to review the exercise physiology of the cardiovascular system and associated critical values. Details cardiovascular disease risk factors and the goals associated with each risk factor. Flowsheet Row Cardiac Rehab from 08/03/2016 in [facility] Cardiac and Pulmonary Rehab  Date  06/29/16  Educator  [facility]  Instruction Review Code  2- meets goals/outcomes      Aerobic Exercise & Resistance Training: - Gives group verbal and written discussion on the health impact of inactivity. On the components of aerobic and resistive training programs and the benefits of this training and how to safely progress through these programs. Flowsheet Row Cardiac Rehab from 08/03/2016 in [facility] Cardiac and Pulmonary Rehab  Date  07/01/16  Educator  AS  Instruction Review Code  2- meets goals/outcomes      Flexibility, Balance, General Exercise Guidelines: - Provides group  verbal and written instruction on the benefits of flexibility and balance training programs. Provides general exercise guidelines with specific guidelines to those with heart or lung disease. Demonstration and skill practice provided. Flowsheet Row Cardiac Rehab from 08/03/2016 in [facility] Cardiac and Pulmonary Rehab  Date  07/06/16  Educator  [facility]  Instruction Review Code  2- meets goals/outcomes      Stress Management: - Provides group verbal and written instruction about the health risks of elevated stress, cause of high stress, and healthy ways to reduce stress. Flowsheet Row Cardiac Rehab from 08/03/2016 in [facility] Cardiac and Pulmonary Rehab  Date  07/15/16  Educator  TS  Instruction Review Code  2- meets goals/outcomes      Depression: - Provides group verbal and written instruction on the correlation between heart/lung disease and depressed mood, treatment options, and the stigmas associated with seeking treatment.   Anatomy & Physiology of the Heart: - Group verbal and written instruction and models provide basic cardiac anatomy and physiology, with the coronary electrical and arterial systems. Review of: AMI, Angina, Valve disease, Heart Failure, Cardiac Arrhythmia, Pacemakers, and the ICD. Flowsheet Row Cardiac Rehab from 08/03/2016 in [facility] Cardiac and Pulmonary Rehab  Date  07/13/16  Educator  CE  Instruction Review Code  2- meets goals/outcomes      Cardiac Procedures: - Group verbal and written instruction and models to describe the testing methods done to diagnose heart disease. Reviews the outcomes of the test results. Describes the treatment choices: Medical Management, Angioplasty, or Coronary Bypass Surgery. Flowsheet Row Cardiac Rehab from 08/03/2016 in [facility] Cardiac and Pulmonary Rehab  Date  07/20/16  Educator  CE  Instruction Review Code  2- meets goals/outcomes      Cardiac Medications: - Group verbal and written instruction to review commonly prescribed medications  for heart disease. Reviews the medication, class of the drug, and side effects. Includes the steps to properly store meds and maintain the prescription regimen. Flowsheet Row Cardiac Rehab from 08/03/2016 in [facility] Cardiac and Pulmonary Rehab  Date  07/27/16  Educator  SB  Instruction Review Code  2- meets goals/outcomes      Go Sex-Intimacy & Heart Disease, Get SMART - Goal Setting: - Group verbal and written instruction through game format to discuss heart disease and the return to sexual intimacy. Provides group verbal and written material to discuss and apply goal setting through the application of the S.M.A.R.T. Method. Flowsheet Row Cardiac Rehab from 08/03/2016 in [facility] Cardiac and Pulmonary Rehab  Date  07/20/16  Educator  CE  Instruction Review Code  2- meets goals/outcomes      Other Matters of the Heart: - Provides group verbal, written materials and models to describe Heart Failure, Angina, Valve Disease, and Diabetes in the realm of heart disease. Includes description of the disease process and treatment options available to the cardiac patient. Flowsheet Row Cardiac Rehab from 08/03/2016 in [facility] Cardiac and Pulmonary Rehab  Date  07/13/16  Educator  CE  Instruction Review Code  2- meets goals/outcomes      Exercise & Equipment Safety: - Individual verbal instruction and demonstration of equipment use and safety with use of the equipment. Flowsheet Row Cardiac Rehab from 08/03/2016 in [facility] Cardiac and Pulmonary Rehab  Date  06/21/16  Educator  C.   Instruction Review Code  2- meets goals/outcomes      Infection Prevention: - Provides verbal and written material to individual with discussion of infection control including proper hand washing and proper equipment cleaning during exercise session. Flowsheet Row Cardiac Rehab from 08/03/2016 in [facility] Cardiac and Pulmonary Rehab  Date  06/21/16  Educator  sb  Instruction Review Code  2- meets goals/outcomes      Falls  Prevention: - Provides verbal and written material to individual with discussion of falls prevention and safety. Flowsheet Row Cardiac Rehab from 08/03/2016 in [facility] Cardiac and Pulmonary Rehab  Date  06/21/16  Educator  sb  Instruction Review Code  2- meets goals/outcomes      Diabetes: - Individual verbal and written instruction to review signs/symptoms of diabetes, desired ranges of glucose level fasting, after meals and with exercise. Advice that pre and post exercise glucose checks will be done for 3 sessions at entry of program.    Knowledge Questionnaire Score:     Knowledge Questionnaire Score - 06/21/16 1459      Knowledge Questionnaire Score   Pre Score 25/28      Core Components/Risk Factors/Patient Goals at Admission:     Personal Goals and Risk Factors at Admission - 07/16/16 0816      Core Components/Risk Factors/Patient Goals on Admission   Intervention Offer individual and/or small group education and counseling on adjustment to heart disease, stress management and health-related lifestyle change. Teach and support self-help strategies.;Refer participants experiencing significant psychosocial distress to appropriate mental health specialists for further evaluation and treatment. When possible, include family members and significant others in education/counseling sessions.      Core Components/Risk Factors/Patient Goals Review:      Goals and Risk Factor Review    Row Name 07/16/16 0859             Core Components/Risk Factors/Patient Goals Review   Personal Goals Review Stress;Hypertension       Review Dawnyel reports high blopod pressure readings at home.  She will bring her cuff to  compare our readaings with her cuff.  She will call her physician to review her readings and how she is stressed over her blood pressure being higher than normal       Expected Outcomes Blood pressure will be controlled and stress will be decreased as she gains better control  over blood pressure.          Core Components/Risk Factors/Patient Goals at Discharge (Final Review):      Goals and Risk Factor Review - 07/16/16 0859      Core Components/Risk Factors/Patient Goals Review   Personal Goals Review Stress;Hypertension   Review Lahari reports high blopod pressure readings at home.  She will bring her cuff to  compare our readaings with her cuff.  She will call her physician to review her readings and how she is stressed over her blood pressure being higher than normal   Expected Outcomes Blood pressure will be controlled and stress will be decreased as she gains better control over blood pressure.      ITP Comments:     ITP Comments    Row Name 06/21/16 1444 07/07/16 0620 08/04/16 0606       ITP Comments Medical review completed. Initial ITP created. Documentation of diagnosis can be found [facility] 04/26/2016 30 day review. Continue with ITP unless directed changes per Medical Director review. New to program 30 day review. Continue with ITP unless directed changes per Medical Director review  Comments:

## 2016-08-05 ENCOUNTER — Encounter: Payer: Medicare Other | Attending: Cardiology | Admitting: *Deleted

## 2016-08-05 DIAGNOSIS — Z952 Presence of prosthetic heart valve: Secondary | ICD-10-CM

## 2016-08-05 DIAGNOSIS — Z954 Presence of other heart-valve replacement: Secondary | ICD-10-CM | POA: Diagnosis not present

## 2016-08-05 NOTE — Progress Notes (Signed)
Daily Session Note  Patient Details  Name: Sheila Osborne MRN: [id number] Date of Birth: [birth year] Referring Provider:   Flowsheet Row Cardiac Rehab from 06/21/2016 in [facility] Cardiac and Pulmonary Rehab  Referring Provider  Bartholome Bill MD      Encounter Date: 08/05/2016  Check In:     Session Check In - 08/05/16 0842      Check-In   Location [facility]-Cardiac & Pulmonary Rehab   Staff Present Alberteen Sam, MA, ACSM RCEP, Exercise Physiologist;Patricia Surles RN Vickki Hearing, BA, ACSM CEP, Exercise Physiologist   Supervising physician immediately available to respond to emergencies See telemetry face sheet for immediately available ER MD   Medication changes reported     No   Fall or balance concerns reported    No   Warm-up and Cool-down Performed on first and last piece of equipment   Resistance Training Performed Yes   VAD Patient? No     Pain Assessment   Currently in Pain? No/denies   Multiple Pain Sites No         History  Smoking Status  . Never Smoker  Smokeless Tobacco  . Never Used    Goals Met:  Independence with exercise equipment Exercise tolerated well No report of cardiac concerns or symptoms Strength training completed today  Goals Unmet: Not applicable  Comments: Pt able to follow exercise prescription today without complaint.  Will continue to monitor for progression.    Dr. Emily Filbert is Medical Director for [facility].

## 2016-08-06 ENCOUNTER — Encounter: Payer: Medicare Other | Admitting: *Deleted

## 2016-08-06 DIAGNOSIS — Z952 Presence of prosthetic heart valve: Secondary | ICD-10-CM

## 2016-08-06 DIAGNOSIS — Z954 Presence of other heart-valve replacement: Secondary | ICD-10-CM | POA: Diagnosis not present

## 2016-08-06 NOTE — Progress Notes (Signed)
Daily Session Note  Patient Details  Name: Lindsi Bayliss MRN: [id number] Date of Birth: 12-30-1935 Referring Provider:   Flowsheet Row Cardiac Rehab from 06/21/2016 in [facility] Cardiac and Pulmonary Rehab  Referring Provider  Bartholome Bill MD      Encounter Date: 08/06/2016  Check In:     Session Check In - 08/06/16 0928      Check-In   Location [facility]-Cardiac & Pulmonary Rehab   Staff Present Alberteen Sam, MA, ACSM RCEP, Exercise Physiologist;Susanne Bice, RN, BSN, CCRP   Supervising physician immediately available to respond to emergencies See telemetry face sheet for immediately available ER MD   Medication changes reported     No   Fall or balance concerns reported    No   Warm-up and Cool-down Performed on first and last piece of equipment   Resistance Training Performed Yes   VAD Patient? No     Pain Assessment   Currently in Pain? No/denies   Multiple Pain Sites No         History  Smoking Status  . Never Smoker  Smokeless Tobacco  . Never Used    Goals Met:  Independence with exercise equipment Exercise tolerated well No report of cardiac concerns or symptoms Strength training completed today  Goals Unmet:  Not Applicable  Comments: Pt able to follow exercise prescription today without complaint.  Will continue to monitor for progression.    Dr. Emily Filbert is Medical Director for [place] and [facility].

## 2016-08-11 DIAGNOSIS — Z952 Presence of prosthetic heart valve: Secondary | ICD-10-CM

## 2016-08-11 NOTE — Progress Notes (Signed)
Daily Session Note  Patient Details  Name: Maryrose Colvin MRN: [id number] Date of Birth: May 26, 1936 Referring Provider:   Flowsheet Row Cardiac Rehab from 06/21/2016 in [facility] Cardiac and Pulmonary Rehab  Referring Provider  Bartholome Bill MD      Encounter Date: 08/11/2016  Check In:     Session Check In - 08/11/16 0840      Check-In   Location [facility]-Cardiac & Pulmonary Rehab   Staff Present Alberteen Sam, MA, ACSM RCEP, Exercise Physiologist;Susanne Bice, RN, BSN, Lance Sell, BA, ACSM CEP, Exercise Physiologist   Supervising physician immediately available to respond to emergencies See telemetry face sheet for immediately available ER MD   Medication changes reported     No   Fall or balance concerns reported    No   Warm-up and Cool-down Performed on first and last piece of equipment   Resistance Training Performed Yes   VAD Patient? No     Pain Assessment   Currently in Pain? No/denies         History  Smoking Status  . Never Smoker  Smokeless Tobacco  . Never Used    Goals Met:  Independence with exercise equipment Exercise tolerated well No report of cardiac concerns or symptoms Strength training completed today  Goals Unmet:  Not Applicable  Comments: Pt able to follow exercise prescription today without complaint.  Will continue to monitor for progression.    Dr. Emily Filbert is Medical Director for [place] and [facility].

## 2016-08-12 ENCOUNTER — Other Ambulatory Visit: Payer: Self-pay | Admitting: General Surgery

## 2016-08-12 ENCOUNTER — Telehealth: Payer: Self-pay | Admitting: *Deleted

## 2016-08-12 VITALS — Ht 64.8 in | Wt 136.0 lb

## 2016-08-12 DIAGNOSIS — Z952 Presence of prosthetic heart valve: Secondary | ICD-10-CM

## 2016-08-12 DIAGNOSIS — Z4931 Encounter for adequacy testing for hemodialysis: Secondary | ICD-10-CM

## 2016-08-12 DIAGNOSIS — Z954 Presence of other heart-valve replacement: Secondary | ICD-10-CM | POA: Diagnosis not present

## 2016-08-12 DIAGNOSIS — Z1211 Encounter for screening for malignant neoplasm of colon: Secondary | ICD-10-CM

## 2016-08-12 MED ORDER — POLYETHYLENE GLYCOL 3350 17 GM/SCOOP PO POWD: ORAL | 0 refills | Status: DC

## 2016-08-12 NOTE — Telephone Encounter (Signed)
Message left on home number for patient to call the office.   Patient is scheduled for a colonoscopy on 08-25-16 at [facility]. We need to confirm that she has not had a change in medications since her last office visit and verify she has picked up her Miralax prescription.   Also, need to remind patient to decrease her current 325 mg aspirin to an 81 mg aspirin one week prior to procedure.

## 2016-08-12 NOTE — Progress Notes (Signed)
Daily Session Note  Patient Details  Name: Sheila Osborne MRN: [id number] Date of Birth: April 02, 1936 Referring Provider:   Flowsheet Row Cardiac Rehab from 06/21/2016 in [facility] Cardiac and Pulmonary Rehab  Referring Provider  Bartholome Bill MD      Encounter Date: 08/12/2016  Check In:     Session Check In - 08/12/16 0825      Check-In   Location [facility]-Cardiac & Pulmonary Rehab   Staff Present Alberteen Sam, MA, ACSM RCEP, Exercise Physiologist;Patricia Surles RN Vickki Hearing, BA, ACSM CEP, Exercise Physiologist   Supervising physician immediately available to respond to emergencies See telemetry face sheet for immediately available ER MD   Medication changes reported     No   Fall or balance concerns reported    No   Warm-up and Cool-down Performed on first and last piece of equipment   Resistance Training Performed Yes   VAD Patient? No     Pain Assessment   Currently in Pain? No/denies           Exercise Prescription Changes - 08/11/16 1500      Response to Exercise   Blood Pressure (Admit) 136/62   Blood Pressure (Exercise) 146/74   Blood Pressure (Exit) 132/76   Heart Rate (Admit) 81 bpm   Heart Rate (Exercise) 120 bpm   Heart Rate (Exit) 62 bpm   Rating of Perceived Exertion (Exercise) 14   Symptoms none   Duration Continue with 45 min of aerobic exercise without signs/symptoms of physical distress.   Intensity THRR unchanged     Progression   Progression Continue to progress workloads to maintain intensity without signs/symptoms of physical distress.   Average METs 7.41     Resistance Training   Training Prescription Yes   Weight 3 lbs   Reps 10-15     Interval Training   Interval Training Yes   Equipment NuStep;REL-XR   Comments 2 min off 30 sec on     Treadmill   MPH 3.5   Grade 3   Minutes 15   METs 5.13     NuStep   Level 4   Minutes 15   METs 7.7     REL-XR   Level 4   Minutes 15   METs 9.4     Home Exercise Plan   Plans to continue exercise at Home (comment)  walking and yoga   Frequency Add 3 additional days to program exercise sessions.   Initial Home Exercises Provided 07/08/16      History  Smoking Status  . Never Smoker  Smokeless Tobacco  . Never Used    Goals Met:  Independence with exercise equipment Exercise tolerated well No report of cardiac concerns or symptoms Strength training completed today  Goals Unmet:  Not Applicable  Comments: Pt able to follow exercise prescription today without complaint.  Will continue to monitor for progression.     [place] Name 06/21/16 1620 08/12/16 1031       6 Minute Walk   Phase Initial Discharge    Distance 1600 feet 1965 feet    Distance % Change  - 22.8 %  365    Walk Time 6 minutes 6 minutes    # of Rest Breaks 0 0    MPH 3.03 3.72    METS 3.14 4.31    RPE 12 15    VO2 Peak 11.01 15.07    Symptoms No No    Resting HR 71  bpm 68 bpm    Resting BP 124/64 118/62    Max Ex. HR 94 bpm 135 bpm    Max Ex. BP 128/64 146/62    2 Minute Post BP 122/60  -         Dr. Emily Filbert is Medical Director for [place] and [facility].

## 2016-08-12 NOTE — Telephone Encounter (Signed)
Patient called the office back to report that she is on the same medications since her last office visit except her amlodipine dosage has changed from 5 mg to 10 mg. Medication list has been updated accordingly.   She states that she has yet to pick up Miralax prescription. Original prescription was sent in to [facility] but patient is requesting that we send this in to [facility]. Electronic prescription has been sent in per patient's request today.   We will proceed with colonoscopy as scheduled.   Patient was instructed to call the office should she have further questions.   Also, patient states that she was told she could remain on her 325 mg aspirin. We will confirm this with Dr. Bary Castilla.

## 2016-08-12 NOTE — Telephone Encounter (Signed)
OK to stay on 325 mg asa.

## 2016-08-13 ENCOUNTER — Telehealth: Payer: Self-pay

## 2016-08-13 ENCOUNTER — Encounter: Payer: Medicare Other | Admitting: *Deleted

## 2016-08-13 ENCOUNTER — Other Ambulatory Visit: Payer: Self-pay

## 2016-08-13 DIAGNOSIS — Z952 Presence of prosthetic heart valve: Secondary | ICD-10-CM

## 2016-08-13 DIAGNOSIS — Z954 Presence of other heart-valve replacement: Secondary | ICD-10-CM | POA: Diagnosis not present

## 2016-08-13 MED ORDER — AMOXICILLIN 500 MG PO CAPS: 500.0000 mg | ORAL_CAPSULE | ORAL | 0 refills | Status: DC

## 2016-08-13 NOTE — Telephone Encounter (Signed)
-----   Message from Robert Bellow, MD sent at 08/12/2016  7:54 PM EST ----- Please notify the patient I would like her to take amoxicillin, 4-500 mg tablets one hour prior to presentation for her colonoscopy. Thank you

## 2016-08-13 NOTE — Progress Notes (Signed)
Daily Session Note  Patient Details  Name: Sheila Osborne MRN: [id number] Date of Birth: December 20, 1935 Referring Provider:   Flowsheet Row Cardiac Rehab from 06/21/2016 in [facility] Cardiac and Pulmonary Rehab  Referring Provider  Bartholome Bill MD      Encounter Date: 08/13/2016  Check In:     Session Check In - 08/13/16 1700      Check-In   Staff Present Heath Lark, RN, BSN, CCRP;Jessica Luan Pulling, MA, ACSM RCEP, Exercise Physiologist;Patricia Surles RN BSN   Supervising physician immediately available to respond to emergencies See telemetry face sheet for immediately available ER MD   Medication changes reported     No   Fall or balance concerns reported    No   Warm-up and Cool-down Performed on first and last piece of equipment   Resistance Training Performed Yes     Pain Assessment   Currently in Pain? No/denies         History  Smoking Status  . Never Smoker  Smokeless Tobacco  . Never Used    Goals Met:  Exercise tolerated well Personal goals reviewed No report of cardiac concerns or symptoms  Goals Unmet:  Not Applicable  Comments: Doing well with exercise prescription progression.    Dr. Emily Filbert is Medical Director for [place] and [facility].

## 2016-08-13 NOTE — Telephone Encounter (Signed)
Notified patient as instructed, patient pleased °

## 2016-08-19 DIAGNOSIS — Z952 Presence of prosthetic heart valve: Secondary | ICD-10-CM

## 2016-08-19 DIAGNOSIS — Z954 Presence of other heart-valve replacement: Secondary | ICD-10-CM | POA: Diagnosis not present

## 2016-08-19 NOTE — Progress Notes (Signed)
Daily Session Note  Patient Details  Name: Sheila Osborne MRN: [id number] Date of Birth: 23-Jan-1936 Referring Provider:     Cardiac Rehab from 06/21/2016 in [facility] Cardiac and Pulmonary Rehab  Referring Provider  Bartholome Bill MD      Encounter Date: 08/19/2016  Check In:     Session Check In - 08/19/16 1104      Check-In   Location [facility]-Cardiac & Pulmonary Rehab   Staff Present Nada Maclachlan, BA, ACSM CEP, Exercise Physiologist;Patricia Surles RN BSN;Other  Darel Hong RN   Supervising physician immediately available to respond to emergencies See telemetry face sheet for immediately available ER MD   Medication changes reported     No   Fall or balance concerns reported    No   Warm-up and Cool-down Performed on first and last piece of equipment   Resistance Training Performed Yes   VAD Patient? No     Pain Assessment   Currently in Pain? No/denies         History  Smoking Status  . Never Smoker  Smokeless Tobacco  . Never Used    Goals Met:  Proper associated with RPD/PD & O2 Sat Independence with exercise equipment Exercise tolerated well No report of cardiac concerns or symptoms  Goals Unmet:  Not Applicable  Comments: Pt able to follow exercise prescription today without complaint.  Will continue to monitor for progression.    Dr. Emily Filbert is Medical Director for [place] and [facility].

## 2016-08-20 ENCOUNTER — Encounter: Payer: Medicare Other | Admitting: *Deleted

## 2016-08-20 DIAGNOSIS — Z952 Presence of prosthetic heart valve: Secondary | ICD-10-CM

## 2016-08-20 DIAGNOSIS — Z954 Presence of other heart-valve replacement: Secondary | ICD-10-CM | POA: Diagnosis not present

## 2016-08-20 NOTE — Progress Notes (Signed)
Daily Session Note  Patient Details  Name: Sheila Osborne MRN: [id number] Date of Birth: [birth year] Referring Provider:     Cardiac Rehab from 06/21/2016 in [facility] Cardiac and Pulmonary Rehab  Referring Provider  Bartholome Bill MD      Encounter Date: 08/20/2016  Check In:     Session Check In - 08/20/16 0758      Check-In   Location [facility]-Cardiac & Pulmonary Rehab   Staff Present Gerlene Burdock, RN, Vickki Hearing, BA, ACSM CEP, Exercise Physiologist;Other  [place] physician immediately available to respond to emergencies See telemetry face sheet for immediately available ER MD   Medication changes reported     No   Fall or balance concerns reported    No   Warm-up and Cool-down Performed on first and last piece of equipment   Resistance Training Performed Yes   VAD Patient? No     Pain Assessment   Currently in Pain? No/denies         History  Smoking Status  . Never Smoker  Smokeless Tobacco  . Never Used    Goals Met:  Proper associated with RPD/PD & O2 Sat Exercise tolerated well Personal goals reviewed  Goals Unmet:  Not Applicable  Comments:     Dr. Emily Filbert is Medical Director for [place] and [facility].

## 2016-08-24 ENCOUNTER — Encounter: Payer: Medicare Other | Admitting: Respiratory Therapy

## 2016-08-24 DIAGNOSIS — Z954 Presence of other heart-valve replacement: Secondary | ICD-10-CM | POA: Diagnosis not present

## 2016-08-24 DIAGNOSIS — Z952 Presence of prosthetic heart valve: Secondary | ICD-10-CM

## 2016-08-24 NOTE — Progress Notes (Signed)
Daily Session Note  Patient Details  Name: Sheila Osborne MRN: [id number] Date of Birth: 1935/09/05 Referring Provider:     Cardiac Rehab from 06/21/2016 in [facility] Cardiac and Pulmonary Rehab  Referring Provider  Bartholome Bill MD      Encounter Date: 08/24/2016  Check In:     Session Check In - 08/24/16 0817      Check-In   Location [facility]-Cardiac & Pulmonary Rehab   Staff Present Carson Myrtle, BS, RRT, Respiratory Lennie Hummer, MA, ACSM RCEP, Exercise Physiologist;Susanne Bice, RN, BSN, CCRP   Supervising physician immediately available to respond to emergencies See telemetry face sheet for immediately available ER MD   Medication changes reported     No   Fall or balance concerns reported    No   Warm-up and Cool-down Performed on first and last piece of equipment   Resistance Training Performed Yes   VAD Patient? No     Pain Assessment   Currently in Pain? No/denies   Multiple Pain Sites No         History  Smoking Status   Never Smoker  Smokeless Tobacco   Never Used    Goals Met:  Proper associated with RPD/PD & O2 Sat Exercise tolerated well Personal goals reviewed No report of cardiac concerns or symptoms Strength training completed today  Goals Unmet:  Not Applicable  Comments: Pt able to follow exercise prescription today without complaint.  Will continue to monitor for progression.   Dr. Emily Filbert is Medical Director for [place] and [facility].

## 2016-08-24 NOTE — Patient Instructions (Signed)
Discharge Progress Report  Patient Details  Name: Sheila Osborne MRN: [id number] Date of Birth: 03/18/1936 Referring Provider:  Teodoro Spray, MD   Number of Visits: 36/36  Reason for Discharge:  Patient reached a stable level of exercise. Patient independent in their exercise.  Smoking History:  History  Smoking Status  . Never Smoker  Smokeless Tobacco  . Never Used    Diagnosis:  S/P TAVR (transcatheter aortic valve replacement)  Initial Exercise Prescription:     Initial Exercise Prescription - 06/21/16 1600      Date of Initial Exercise RX and Referring Provider   Date 06/21/16   Referring Provider Bartholome Bill MD     Treadmill   MPH 2.8   Grade 0   Minutes 15   METs 3.14     NuStep   Level 2   Minutes 15   METs 2     Recumbant Elliptical   Level 1   RPM 50   Minutes 15   METs 2     Prescription Details   Frequency (times per week) 3   Duration Progress to 45 minutes of aerobic exercise without signs/symptoms of physical distress     Intensity   THRR 40-80% of Max Heartrate 99-128   Ratings of Perceived Exertion 11-15   Perceived Dyspnea 0-4     Progression   Progression Continue to progress workloads to maintain intensity without signs/symptoms of physical distress.     Resistance Training   Training Prescription Yes   Weight 3 lbs   Reps 10-15      Discharge Exercise Prescription (Final Exercise Prescription Changes):     Exercise Prescription Changes - 08/11/16 1500      Response to Exercise   Blood Pressure (Admit) 136/62   Blood Pressure (Exercise) 146/74   Blood Pressure (Exit) 132/76   Heart Rate (Admit) 81 bpm   Heart Rate (Exercise) 120 bpm   Heart Rate (Exit) 62 bpm   Rating of Perceived Exertion (Exercise) 14   Symptoms none   Duration Continue with 45 min of aerobic exercise without signs/symptoms of physical distress.   Intensity THRR unchanged     Progression   Progression Continue to progress  workloads to maintain intensity without signs/symptoms of physical distress.   Average METs 7.41     Resistance Training   Training Prescription Yes   Weight 3 lbs   Reps 10-15     Interval Training   Interval Training Yes   Equipment NuStep;REL-XR   Comments 2 min off 30 sec on     Treadmill   MPH 3.5   Grade 3   Minutes 15   METs 5.13     NuStep   Level 4   Minutes 15   METs 7.7     REL-XR   Level 4   Minutes 15   METs 9.4     Home Exercise Plan   Plans to continue exercise at Home (comment)  walking and yoga   Frequency Add 3 additional days to program exercise sessions.   Initial Home Exercises Provided 07/08/16      Functional Capacity:     6 Minute Walk    [place] Name 06/21/16 1620 08/12/16 1031       6 Minute Walk   Phase Initial Discharge    Distance 1600 feet 1965 feet    Distance % Change  - 22.8 %  365    Walk Time 6 minutes 6 minutes    #  of Rest Breaks 0 0    MPH 3.03 3.72    METS 3.14 4.31    RPE 12 15    VO2 Peak 11.01 15.07    Symptoms No No    Resting HR 71 bpm 68 bpm    Resting BP 124/64 118/62    Max Ex. HR 94 bpm 135 bpm    Max Ex. BP 128/64 146/62    2 Minute Post BP 122/60  -       Quality of Life:     Quality of Life - 08/19/16 1041      Quality of Life Scores   Health/Function Post 27.86 %   Socioeconomic Post 29.25 %   Psych/Spiritual Post 24.86 %   Family Post 30 %   GLOBAL Post 27.82 %      Personal Goals: Goals established at orientation with interventions provided to work toward goal.     Personal Goals and Risk Factors at Admission - 07/16/16 0816      Core Components/Risk Factors/Patient Goals on Admission   Intervention Offer individual and/or small group education and counseling on adjustment to heart disease, stress management and health-related lifestyle change. Teach and support self-help strategies.;Refer participants experiencing significant psychosocial distress to appropriate mental health  specialists for further evaluation and treatment. When possible, include family members and significant others in education/counseling sessions.       Personal Goals Discharge:     Goals and Risk Factor Review - 08/20/16 0824      Core Components/Risk Factors/Patient Goals Review   Review Blood pressure is good and her stress level is lower    Expected Outcomes Heart healthy lifestyle.      Nutrition & Weight - Outcomes:     Pre Biometrics - 06/21/16 1622      Pre Biometrics   Height 5' 4.8" (1.646 m)   Weight 133 lb (60.3 kg)   Waist Circumference 28 inches   Hip Circumference 36 inches   Waist to Hip Ratio 0.78 %   BMI (Calculated) 22.3   Single Leg Stand 30 seconds         Post Biometrics - 08/12/16 1114       Post  Biometrics   Height 5' 4.8" (1.646 m)   Weight 136 lb (61.7 kg)   Waist Circumference 29 inches   Hip Circumference 37 inches   Waist to Hip Ratio 0.78 %   BMI (Calculated) 22.8   Single Leg Stand 30 seconds      Nutrition:     Nutrition Therapy & Goals - 08/20/16 0822      Nutrition Therapy   RD appointment defered Yes      Nutrition Discharge:     Nutrition Assessments - 08/19/16 1041      Rate Your Plate Scores   Post Score 73      Education Questionnaire Score:     Knowledge Questionnaire Score - 08/19/16 1030      Knowledge Questionnaire Score   Post Score 26/28      Goals reviewed with patient; copy given to patient.

## 2016-08-25 ENCOUNTER — Encounter: Payer: Self-pay | Admitting: *Deleted

## 2016-08-25 ENCOUNTER — Ambulatory Visit: Payer: Medicare Other | Admitting: Certified Registered Nurse Anesthetist

## 2016-08-25 ENCOUNTER — Encounter
Admission: RE | Disposition: A | Discharge status: Home / Self Care | Payer: Self-pay | Source: Ambulatory Visit | Attending: General Surgery

## 2016-08-25 ENCOUNTER — Ambulatory Visit
Admission: RE | Admit: 2016-08-25 | Discharge: 2016-08-25 | Disposition: A | Discharge status: Home / Self Care | Payer: Medicare Other | Source: Ambulatory Visit | Attending: General Surgery | Admitting: General Surgery

## 2016-08-25 DIAGNOSIS — Z7982 Long term (current) use of aspirin: Secondary | ICD-10-CM | POA: Diagnosis not present

## 2016-08-25 DIAGNOSIS — Z1211 Encounter for screening for malignant neoplasm of colon: Secondary | ICD-10-CM | POA: Diagnosis not present

## 2016-08-25 DIAGNOSIS — K5730 Diverticulosis of large intestine without perforation or abscess without bleeding: Secondary | ICD-10-CM | POA: Insufficient documentation

## 2016-08-25 DIAGNOSIS — I10 Essential (primary) hypertension: Secondary | ICD-10-CM | POA: Diagnosis not present

## 2016-08-25 DIAGNOSIS — D120 Benign neoplasm of cecum: Secondary | ICD-10-CM | POA: Insufficient documentation

## 2016-08-25 DIAGNOSIS — Z952 Presence of prosthetic heart valve: Secondary | ICD-10-CM | POA: Diagnosis not present

## 2016-08-25 DIAGNOSIS — Z79899 Other long term (current) drug therapy: Secondary | ICD-10-CM | POA: Insufficient documentation

## 2016-08-25 DIAGNOSIS — Z4931 Encounter for adequacy testing for hemodialysis: Secondary | ICD-10-CM

## 2016-08-25 HISTORY — PX: COLONOSCOPY WITH PROPOFOL: SHX5780

## 2016-08-25 SURGERY — COLONOSCOPY WITH PROPOFOL
Anesthesia: General

## 2016-08-25 MED ORDER — LABETALOL HCL 5 MG/ML IV SOLN
INTRAVENOUS | Status: AC
  Filled 2016-08-25: qty 4

## 2016-08-25 MED ORDER — PROPOFOL 10 MG/ML IV BOLUS
INTRAVENOUS | Status: DC | PRN
  Administered 2016-08-25: 30 mg via INTRAVENOUS

## 2016-08-25 MED ORDER — SODIUM CHLORIDE 0.9 % IV SOLN
INTRAVENOUS | Status: DC
  Administered 2016-08-25: 1000 mL via INTRAVENOUS

## 2016-08-25 MED ORDER — PROPOFOL 10 MG/ML IV BOLUS
INTRAVENOUS | Status: AC
  Filled 2016-08-25: qty 20

## 2016-08-25 MED ORDER — LIDOCAINE HCL (PF) 1 % IJ SOLN
2.0000 mL | Freq: Once | INTRAMUSCULAR | Status: AC
  Administered 2016-08-25: 0.3 mL via INTRADERMAL
  Filled 2016-08-25: qty 2

## 2016-08-25 MED ORDER — MIDAZOLAM HCL 2 MG/2ML IJ SOLN
INTRAMUSCULAR | Status: AC
  Filled 2016-08-25: qty 2

## 2016-08-25 MED ORDER — PROPOFOL 500 MG/50ML IV EMUL
INTRAVENOUS | Status: AC
  Filled 2016-08-25: qty 50

## 2016-08-25 MED ORDER — LIDOCAINE HCL (CARDIAC) 20 MG/ML IV SOLN
INTRAVENOUS | Status: DC | PRN
  Administered 2016-08-25: 50 mg via INTRAVENOUS

## 2016-08-25 MED ORDER — PROPOFOL 500 MG/50ML IV EMUL
INTRAVENOUS | Status: DC | PRN
  Administered 2016-08-25: 175 ug/kg/min via INTRAVENOUS

## 2016-08-25 MED ORDER — MIDAZOLAM HCL 2 MG/2ML IJ SOLN
INTRAMUSCULAR | Status: DC | PRN
  Administered 2016-08-25: 2 mg via INTRAVENOUS

## 2016-08-25 NOTE — Op Note (Signed)
[facility] Gastroenterology Patient Name: Sheila Osborne Procedure Date: 08/25/2016 9:11 AM MRN: [id number] Account #: [id number] Date of Birth: Feb 01, 1936 Admit Type: Outpatient Age: 81 Room: [facility] ENDO ROOM 1 Gender: Female Note Status: Finalized Procedure:            Colonoscopy Indications:          Screening for colorectal malignant neoplasm Providers:            Robert Bellow, MD Referring MD:         Lenard Simmer, MD (Referring MD) Medicines:            Monitored Anesthesia Care Complications:        No immediate complications. Procedure:            Pre-Anesthesia Assessment:                       - Prior to the procedure, a History and Physical was                        performed, and patient medications, allergies and                        sensitivities were reviewed. The patient's tolerance of                        previous anesthesia was reviewed.                       - The risks and benefits of the procedure and the                        sedation options and risks were discussed with the                        patient. All questions were answered and informed                        consent was obtained.                       After obtaining informed consent, the colonoscope was                        passed under direct vision. Throughout the procedure,                        the patient's blood pressure, pulse, and oxygen                        saturations were monitored continuously. The                        Colonoscope was introduced through the anus and                        advanced to the the cecum, identified by appendiceal                        orifice and ileocecal valve. The colonoscopy was  somewhat difficult due to poor bowel prep and                        significant looping. Successful completion of the                        procedure was aided by using manual pressure. The      patient tolerated the procedure well. The quality of                        the bowel preparation was adequate to identify polyps 6                        mm and larger in size. Findings:      A 8 mm polyp was found in the cecum. The polyp was sessile. The polyp       was removed with a hot snare. Resection and retrieval were complete.      Multiple medium-mouthed diverticula were found in the sigmoid colon.      The retroflexed view of the distal rectum and anal verge was normal and       showed no anal or rectal abnormalities. Impression:           - One 8 mm polyp in the cecum, removed with a hot                        snare. Resected and retrieved.                       - Diverticulosis in the sigmoid colon.                       - The distal rectum and anal verge are normal on                        retroflexion view. Recommendation:       - Telephone endoscopist for pathology results in 1 week. Procedure Code(s):    --- Professional ---                       [phone], Colonoscopy, flexible; with removal of tumor(s),                        polyp(s), or other lesion(s) by snare technique Diagnosis Code(s):    --- Professional ---                       Z12.11, Encounter for screening for malignant neoplasm                        of colon                       D12.0, Benign neoplasm of cecum                       K57.30, Diverticulosis of large intestine without                        perforation or abscess without bleeding CPT copyright 2016 American Medical Association. All rights reserved. The codes documented in this  report are preliminary and upon coder review may  be revised to meet current compliance requirements. Robert Bellow, MD 08/25/2016 10:04:12 AM This report has been signed electronically. Number of Addenda: 0 Note Initiated On: 08/25/2016 9:11 AM Scope Withdrawal Time: 0 hours 28 minutes 40 seconds  Total Procedure Duration: 0 hours 41 minutes 54 seconds        [facility]

## 2016-08-25 NOTE — Transfer of Care (Signed)
Immediate Anesthesia Transfer of Care Note  Patient: Sheila Osborne  Procedure(s) Performed: Procedure(s): COLONOSCOPY WITH PROPOFOL (N/A)  Patient Location: PACU  Anesthesia Type:General  Level of Consciousness: sedated  Airway & Oxygen Therapy: Patient Spontanous Breathing and Patient connected to nasal cannula oxygen  Post-op Assessment: Report given to RN and Post -op Vital signs reviewed and stable  Post vital signs: Reviewed and stable  Last Vitals:  Vitals:   08/25/16 0819 08/25/16 1010  BP: 130/68 98/70  Pulse: 70 74  Resp: (!) 22 18  Temp: 36.5 C     Last Pain:  Vitals:   08/25/16 0819  TempSrc: Tympanic         Complications: No apparent anesthesia complications

## 2016-08-25 NOTE — H&P (Signed)
Sheila Osborne [id number] Feb 22, 1936     HPI: Candidate for screening colonoscopy. Took Amoxacillin as requested. Tolerated prep well.   Prescriptions Prior to Admission  Medication Sig Dispense Refill Last Dose  . alendronate (FOSAMAX) 70 MG tablet Take 70 mg by mouth once a week.    Taking  . amLODipine (NORVASC) 10 MG tablet Take 10 mg by mouth daily.   08/25/2016 at 0615  . amoxicillin (AMOXIL) 500 MG capsule Take 1 capsule (500 mg total) by mouth as directed. Take all 4 capsules 1 hour prior to arrival time for your colonoscopy 4 capsule 0 08/25/2016 at 0615  . aspirin 325 MG tablet Take 325 mg by mouth daily.   08/25/2016 at 0615  . Ergocalciferol (VITAMIN D2 PO) Take 1.25 mg by mouth once a week.   Taking  . metoprolol tartrate (LOPRESSOR) 12.5 mg TABS tablet Take by mouth.   08/25/2016 at 0615  . polyethylene glycol powder (GLYCOLAX/MIRALAX) powder 255 grams one bottle for colonoscopy prep 255 g 0   . vitamin C (ASCORBIC ACID) 500 MG tablet Take 500 mg by mouth daily.   Taking   No Known Allergies Past Medical History:  Diagnosis Date  . Carotid bruit   . Hypertension   . Syncope 2012   Past Surgical History:  Procedure Laterality Date  . AORTIC VALVE REPLACEMENT  04/26/2016  . BREAST BIOPSY Left 1997   core- neg  . CARDIAC CATHETERIZATION Bilateral 02/13/2016   Procedure: Right/Left Heart Cath and Coronary Angiography;  Surgeon: Teodoro Spray, MD;  Location: [facility];  Service: Cardiovascular;  Laterality: Bilateral;  . CARDIAC VALVE REPLACEMENT    . CATARACT EXTRACTION  2014   x2  . COLONOSCOPY  2006   Social History   Social History  . Marital status: Widowed    Spouse name: N/A  . Number of children: N/A  . Years of education: N/A   Occupational History  . Not on file.   Social History Main Topics  . Smoking status: Never Smoker  . Smokeless tobacco: Never Used  . Alcohol use 0.0 oz/week     Comment: 1/day  . Drug use: No  . Sexual  activity: Not on file   Other Topics Concern  . Not on file   Social History Narrative  . No narrative on file   Social History   Social History Narrative  . No narrative on file     ROS: Negative.     PE: HEENT: Negative. Lungs: Clear. Cardio: RR.  Assessment/Plan:  Proceed with planned endoscopy.    Robert Bellow 08/25/2016   Assessment/Plan:  Proceed with planned endoscopy.

## 2016-08-25 NOTE — Anesthesia Preprocedure Evaluation (Signed)
Anesthesia Evaluation  Patient identified by MRN, date of birth, ID band  Reviewed: Allergy & Precautions, NPO status , Patient's Chart, lab work & pertinent test results  Airway Mallampati: II       Dental  (+) Teeth Intact   Pulmonary neg pulmonary ROS,    breath sounds clear to auscultation       Cardiovascular Exercise Tolerance: Good hypertension, Pt. on medications II Rhythm:Regular     Neuro/Psych negative neurological ROS     GI/Hepatic negative GI ROS, Neg liver ROS,   Endo/Other    Renal/GU negative Renal ROS     Musculoskeletal negative musculoskeletal ROS (+)   Abdominal Normal abdominal exam  (+)   Peds  Hematology negative hematology ROS (+)   Anesthesia Other Findings   Reproductive/Obstetrics                             Anesthesia Physical Anesthesia Plan  ASA: II  Anesthesia Plan: General   Post-op Pain Management:    Induction: Intravenous  Airway Management Planned: Natural Airway and Nasal Cannula  Additional Equipment:   Intra-op Plan:   Post-operative Plan:   Informed Consent: I have reviewed the patients History and Physical, chart, labs and discussed the procedure including the risks, benefits and alternatives for the proposed anesthesia with the patient or authorized representative who has indicated his/her understanding and acceptance.     Plan Discussed with: CRNA  Anesthesia Plan Comments:         Anesthesia Quick Evaluation

## 2016-08-25 NOTE — Anesthesia Post-op Follow-up Note (Cosign Needed)
Anesthesia QCDR form completed.        

## 2016-08-25 NOTE — Anesthesia Procedure Notes (Signed)
Date/Time: 08/25/2016 9:15 AM Performed by: Johnna Acosta Pre-anesthesia Checklist: Patient identified, Emergency Drugs available, Suction available, Patient being monitored and Timeout performed Patient Re-evaluated:Patient Re-evaluated prior to inductionOxygen Delivery Method: Nasal cannula

## 2016-08-25 NOTE — Anesthesia Postprocedure Evaluation (Signed)
Anesthesia Post Note  Patient: Sheila Osborne  Procedure(s) Performed: Procedure(s) (LRB): COLONOSCOPY WITH PROPOFOL (N/A)  Patient location during evaluation: PACU Anesthesia Type: General Level of consciousness: awake Pain management: pain level controlled Vital Signs Assessment: post-procedure vital signs reviewed and stable Respiratory status: spontaneous breathing Cardiovascular status: stable Anesthetic complications: no     Last Vitals:  Vitals:   08/25/16 1020 08/25/16 1030  BP: 101/74 121/76  Pulse: 65 68  Resp: 15 17  Temp:      Last Pain:  Vitals:   08/25/16 1008  TempSrc: Temporal                 VAN STAVEREN,Manuelita Moxon

## 2016-08-26 ENCOUNTER — Encounter: Payer: Self-pay | Admitting: General Surgery

## 2016-08-26 ENCOUNTER — Telehealth: Payer: Self-pay | Admitting: General Surgery

## 2016-08-26 LAB — SURGICAL PATHOLOGY

## 2016-08-26 NOTE — Telephone Encounter (Signed)
Was notified that the polyp removed from the cecum at the time of her recent colonoscopy was benign. Normal protocol would be a follow-up exam in 5 years. We'll assess her clinical status at that time.

## 2016-08-27 ENCOUNTER — Encounter: Payer: Medicare Other | Admitting: *Deleted

## 2016-08-27 DIAGNOSIS — Z954 Presence of other heart-valve replacement: Secondary | ICD-10-CM | POA: Diagnosis not present

## 2016-08-27 DIAGNOSIS — Z952 Presence of prosthetic heart valve: Secondary | ICD-10-CM

## 2016-08-27 NOTE — Progress Notes (Signed)
Discharge Summary  Patient Details  Name: Sheila Osborne MRN: [id number] Date of Birth: 22-Jul-1935 Referring Provider:     Cardiac Rehab from 06/21/2016 in [facility]  Referring Provider  Bartholome Bill MD       Number of Visits: 36  Reason for Discharge:  Patient reached a stable level of exercise. Patient independent in their exercise.  Smoking History:  History  Smoking Status  . Never Smoker  Smokeless Tobacco  . Never Used    Diagnosis:  S/P TAVR (transcatheter aortic valve replacement)  ADL UCSD:   Initial Exercise Prescription:     Initial Exercise Prescription - 06/21/16 1600      Date of Initial Exercise RX and Referring Provider   Date 06/21/16   Referring Provider Bartholome Bill MD     Treadmill   MPH 2.8   Grade 0   Minutes 15   METs 3.14     NuStep   Level 2   Minutes 15   METs 2     Recumbant Elliptical   Level 1   RPM 50   Minutes 15   METs 2     Prescription Details   Frequency (times per week) 3   Duration Progress to 45 minutes of aerobic exercise without signs/symptoms of physical distress     Intensity   THRR 40-80% of Max Heartrate 99-128   Ratings of Perceived Exertion 11-15   Perceived Dyspnea 0-4     Progression   Progression Continue to progress workloads to maintain intensity without signs/symptoms of physical distress.     Resistance Training   Training Prescription Yes   Weight 3 lbs   Reps 10-15      Discharge Exercise Prescription (Final Exercise Prescription Changes):     Exercise Prescription Changes - 08/25/16 1000      Response to Exercise   Blood Pressure (Admit) 118/60   Blood Pressure (Exercise) 170/76   Blood Pressure (Exit) 120/68   Heart Rate (Admit) 88 bpm   Heart Rate (Exercise) 127 bpm   Heart Rate (Exit) 80 bpm   Rating of Perceived Exertion (Exercise) 15   Symptoms none   Duration Continue with 45 min of aerobic exercise without signs/symptoms of  physical distress.   Intensity THRR unchanged     Progression   Progression Continue to progress workloads to maintain intensity without signs/symptoms of physical distress.   Average METs 6.61     Resistance Training   Training Prescription Yes   Weight 3 lbs   Reps 10-15     Interval Training   Interval Training Yes   Equipment NuStep;REL-XR   Comments 2 min off 30 sec on     Treadmill   MPH 3.5   Grade 3   Minutes 15   METs 5.13     NuStep   Level 4   Minutes 15   METs 5.3     REL-XR   Level 4   Minutes 15   METs 9.4     Home Exercise Plan   Plans to continue exercise at Home (comment)  walking and yoga   Frequency Add 3 additional days to program exercise sessions.   Initial Home Exercises Provided 07/08/16      Functional Capacity:     6 Minute Walk    Row Name 06/21/16 1620 08/12/16 1031       6 Minute Walk   Phase Initial Discharge    Distance 1600 feet 1965 feet  Distance % Change  - 22.8 %  365    Walk Time 6 minutes 6 minutes    # of Rest Breaks 0 0    MPH 3.03 3.72    METS 3.14 4.31    RPE 12 15    VO2 Peak 11.01 15.07    Symptoms No No    Resting HR 71 bpm 68 bpm    Resting BP 124/64 118/62    Max Ex. HR 94 bpm 135 bpm    Max Ex. BP 128/64 146/62    2 Minute Post BP 122/60  -       Psychological, QOL, Others - Outcomes: PHQ 2/9: Depression screen PHQ 2/9 06/21/2016  Decreased Interest 0  Down, Depressed, Hopeless 0  PHQ - 2 Score 0  Altered sleeping 0  Tired, decreased energy 0  Change in appetite 0  Feeling bad or failure about yourself  0  Trouble concentrating 0  Moving slowly or fidgety/restless 0  Suicidal thoughts 0  PHQ-9 Score 0    Quality of Life:     Quality of Life - 08/19/16 1041      Quality of Life Scores   Health/Function Post 27.86 %   Socioeconomic Post 29.25 %   Psych/Spiritual Post 24.86 %   Family Post 30 %   GLOBAL Post 27.82 %      Personal Goals: Goals established at orientation with  interventions provided to work toward goal.     Personal Goals and Risk Factors at Admission - 07/16/16 0816      Core Components/Risk Factors/Patient Goals on Admission   Intervention Offer individual and/or small group education and counseling on adjustment to heart disease, stress management and health-related lifestyle change. Teach and support self-help strategies.;Refer participants experiencing significant psychosocial distress to appropriate mental health specialists for further evaluation and treatment. When possible, include family members and significant others in education/counseling sessions.       Personal Goals Discharge:     Goals and Risk Factor Review    Row Name 07/16/16 0859 08/13/16 0825 08/20/16 8185         Core Components/Risk Factors/Patient Goals Review   Personal Goals Review Stress;Hypertension Lipids;Hypertension;Stress  -     Review Breyonna reports high blopod pressure readings at home.  She will bring her cuff to  compare our readaings with her cuff.  She will call her physician to review her readings and how she is stressed over her blood pressure being higher than normal Aissa has seen her doctor and has a new blood pressure medication with great response lowering her readings. Her stress is absolutely better since her BP is controlled.  Blood pressure is good and her stress level is lower      Expected Outcomes Blood pressure will be controlled and stress will be decreased as she gains better control over blood pressure. Blood pressure will maintain controlled with new medication and stress will continue to be decreased as she gains better control over blood pressure. Heart healthy lifestyle.        Nutrition & Weight - Outcomes:     Pre Biometrics - 06/21/16 1622      Pre Biometrics   Height 5' 4.8" (1.646 m)   Weight 133 lb (60.3 kg)   Waist Circumference 28 inches   Hip Circumference 36 inches   Waist to Hip Ratio 0.78 %   BMI (Calculated) 22.3    Single Leg Stand 30 seconds  Post Biometrics - 08/12/16 1114       Post  Biometrics   Height 5' 4.8" (1.646 m)   Weight 136 lb (61.7 kg)   Waist Circumference 29 inches   Hip Circumference 37 inches   Waist to Hip Ratio 0.78 %   BMI (Calculated) 22.8   Single Leg Stand 30 seconds      Nutrition:     Nutrition Therapy & Goals - 08/20/16 0822      Nutrition Therapy   RD appointment defered Yes      Nutrition Discharge:     Nutrition Assessments - 08/19/16 1041      Rate Your Plate Scores   Post Score 73      Education Questionnaire Score:     Knowledge Questionnaire Score - 08/19/16 1030      Knowledge Questionnaire Score   Post Score 26/28      Goals reviewed with patient; copy given to patient.

## 2016-08-27 NOTE — Progress Notes (Signed)
Cardiac Individual Treatment Plan  Patient Details  Name: Sheila Osborne MRN: [id number] Date of Birth: 1936/05/07 Referring Provider:     Cardiac Rehab from 06/21/2016 in [facility] Cardiac and Pulmonary Rehab  Referring Provider  Bartholome Bill MD      Initial Encounter Date:    Cardiac Rehab from 06/21/2016 in [facility] Cardiac and Pulmonary Rehab  Date  06/21/16  Referring Provider  Bartholome Bill MD      Visit Diagnosis: S/P TAVR (transcatheter aortic valve replacement)  Patient's Home Medications on Admission:  Current Outpatient Prescriptions:  .  alendronate (FOSAMAX) 70 MG tablet, Take 70 mg by mouth once a week. , Disp: , Rfl:  .  amLODipine (NORVASC) 10 MG tablet, Take 10 mg by mouth daily., Disp: , Rfl:  .  aspirin 325 MG tablet, Take 325 mg by mouth daily., Disp: , Rfl:  .  Ergocalciferol (VITAMIN D2 PO), Take 1.25 mg by mouth once a week., Disp: , Rfl:  .  metoprolol tartrate (LOPRESSOR) 12.5 mg TABS tablet, Take by mouth., Disp: , Rfl:  .  vitamin C (ASCORBIC ACID) 500 MG tablet, Take 500 mg by mouth daily., Disp: , Rfl:   Past Medical History: Past Medical History:  Diagnosis Date  . Carotid bruit   . Hypertension   . Syncope 2012    Tobacco Use: History  Smoking Status  . Never Smoker  Smokeless Tobacco  . Never Used    Labs: Recent Review Flowsheet Data    There is no flowsheet data to display.       Exercise Target Goals:    Exercise Program Goal: Individual exercise prescription set with THRR, safety & activity barriers. Participant demonstrates ability to understand and report RPE using BORG scale, to self-measure pulse accurately, and to acknowledge the importance of the exercise prescription.  Exercise Prescription Goal: Starting with aerobic activity 30 plus minutes a day, 3 days per week for initial exercise prescription. Provide home exercise prescription and guidelines that participant acknowledges understanding prior to  discharge.  Activity Barriers & Risk Stratification:     Activity Barriers & Cardiac Risk Stratification - 06/21/16 1131      Activity Barriers & Cardiac Risk Stratification   Activity Barriers Arthritis   Cardiac Risk Stratification High      6 Minute Walk:     6 Minute Walk    Row Name 06/21/16 1620 08/12/16 1031       6 Minute Walk   Phase Initial Discharge    Distance 1600 feet 1965 feet    Distance % Change  - 22.8 %  365    Walk Time 6 minutes 6 minutes    # of Rest Breaks 0 0    MPH 3.03 3.72    METS 3.14 4.31    RPE 12 15    VO2 Peak 11.01 15.07    Symptoms No No    Resting HR 71 bpm 68 bpm    Resting BP 124/64 118/62    Max Ex. HR 94 bpm 135 bpm    Max Ex. BP 128/64 146/62    2 Minute Post BP 122/60  -       Oxygen Initial Assessment:   Oxygen Re-Evaluation:   Oxygen Discharge (Final Oxygen Re-Evaluation):   Initial Exercise Prescription:     Initial Exercise Prescription - 06/21/16 1600      Date of Initial Exercise RX and Referring Provider   Date 06/21/16   Referring Provider Bartholome Bill MD  Treadmill   MPH 2.8   Grade 0   Minutes 15   METs 3.14     NuStep   Level 2   Minutes 15   METs 2     Recumbant Elliptical   Level 1   RPM 50   Minutes 15   METs 2     Prescription Details   Frequency (times per week) 3   Duration Progress to 45 minutes of aerobic exercise without signs/symptoms of physical distress     Intensity   THRR 40-80% of Max Heartrate 99-128   Ratings of Perceived Exertion 11-15   Perceived Dyspnea 0-4     Progression   Progression Continue to progress workloads to maintain intensity without signs/symptoms of physical distress.     Resistance Training   Training Prescription Yes   Weight 3 lbs   Reps 10-15      Perform Capillary Blood Glucose checks as needed.  Exercise Prescription Changes:     Exercise Prescription Changes    Row Name 06/21/16 1400 06/29/16 1400 07/08/16 1000 07/14/16  1500 07/28/16 1500     Response to Exercise   Blood Pressure (Admit) 124/64 148/90  - 146/74 118/70   Blood Pressure (Exercise) 128/64 158/84  - 150/88 146/80   Blood Pressure (Exit) 122/60 146/80  - 132/76 126/68   Heart Rate (Admit) 71 bpm 69 bpm  - 73 bpm 60 bpm   Heart Rate (Exercise) 94 bpm 105 bpm  - 113 bpm 133 bpm   Heart Rate (Exit) 78 bpm 67 bpm  - 72 bpm 66 bpm   Oxygen Saturation (Admit) 99 %  -  -  -  -   Oxygen Saturation (Exercise) 99 %  -  -  -  -   Rating of Perceived Exertion (Exercise) 12 13  - 13 13   Symptoms  - none none none none   Comments  -  - Home Exercise Guidelines given 07/08/16 Home Exercise Guidelines given 07/08/16 Home Exercise Guidelines given 07/08/16   Duration  - Progress to 45 minutes of aerobic exercise without signs/symptoms of physical distress Progress to 45 minutes of aerobic exercise without signs/symptoms of physical distress Progress to 45 minutes of aerobic exercise without signs/symptoms of physical distress Progress to 45 minutes of aerobic exercise without signs/symptoms of physical distress   Intensity  - THRR unchanged THRR unchanged THRR unchanged THRR unchanged     Progression   Progression  - Continue to progress workloads to maintain intensity without signs/symptoms of physical distress. Continue to progress workloads to maintain intensity without signs/symptoms of physical distress. Continue to progress workloads to maintain intensity without signs/symptoms of physical distress. Continue to progress workloads to maintain intensity without signs/symptoms of physical distress.   Average METs  - 3.7 3.7 4.34 5.16     Resistance Training   Training Prescription  - Yes Yes Yes Yes   Weight  - 3 lbs 3 lbs 3 lbs 3 lbs   Reps  - 10-15 10-15 10-15 10-15     Interval Training   Interval Training  - No No No No     Treadmill   MPH  -  -  - 3 3.2   Grade  -  -  - 1 3   Minutes  -  -  - 15 15   METs  -  -  - 3.71 4.77     NuStep   Level  - '2  2 4 ' 4  Minutes  - '15 15 15 15   ' METs  - 2.9 2.9 3.9 4.5     REL-XR   Level  - '2 2 4 4   ' Minutes  - '15 15 15 15   ' METs  - 4.5 4.5 5.4 6.2     Home Exercise Plan   Plans to continue exercise at  -  - Home  walking and yoga Home  walking and yoga Home  walking and yoga   Frequency  -  - Add 3 additional days to program exercise sessions. Add 3 additional days to program exercise sessions. Add 3 additional days to program exercise sessions.     Exercise Review   Progression -  walk test results -  First Full Day of Exercise  - Yes Yes   Row Name 08/11/16 1500 08/25/16 1000           Response to Exercise   Blood Pressure (Admit) 136/62 118/60      Blood Pressure (Exercise) 146/74 170/76      Blood Pressure (Exit) 132/76 120/68      Heart Rate (Admit) 81 bpm 88 bpm      Heart Rate (Exercise) 120 bpm 127 bpm      Heart Rate (Exit) 62 bpm 80 bpm      Rating of Perceived Exertion (Exercise) 14 15      Symptoms none none      Duration Continue with 45 min of aerobic exercise without signs/symptoms of physical distress. Continue with 45 min of aerobic exercise without signs/symptoms of physical distress.      Intensity THRR unchanged THRR unchanged        Progression   Progression Continue to progress workloads to maintain intensity without signs/symptoms of physical distress. Continue to progress workloads to maintain intensity without signs/symptoms of physical distress.      Average METs 7.41 6.61        Resistance Training   Training Prescription Yes Yes      Weight 3 lbs 3 lbs      Reps 10-15 10-15        Interval Training   Interval Training Yes Yes      Equipment NuStep;REL-XR NuStep;REL-XR      Comments 2 min off 30 sec on 2 min off 30 sec on        Treadmill   MPH 3.5 3.5      Grade 3 3      Minutes 15 15      METs 5.13 5.13        NuStep   Level 4 4      Minutes 15 15      METs 7.7 5.3        REL-XR   Level 4 4      Minutes 15 15      METs 9.4 9.4         Home Exercise Plan   Plans to continue exercise at Home (comment)  walking and yoga Home (comment)  walking and yoga      Frequency Add 3 additional days to program exercise sessions. Add 3 additional days to program exercise sessions.      Initial Home Exercises Provided 07/08/16 07/08/16         Exercise Comments:     Exercise Comments    Row Name 06/29/16 1028 07/08/16 1016 07/14/16 1523 07/28/16 1527 07/29/16 1437   Exercise Comments First full day of exercise!  Patient was  oriented to gym and equipment including functions, settings, policies, and procedures.  Patient's individual exercise prescription and treatment plan were reviewed.  All starting workloads were established based on the results of the 6 minute walk test done at initial orientation visit.  The plan for exercise progression was also introduced and progression will be customized based on patient's performance and goals. Reviewed home exercise with pt today.  Pt plans to walk and do yoga at home for exercise.  Angie was encouraged to try to attend a yoga class in person to make sure she is using proper form and technique.  Reviewed THR, pulse, RPE, sign and symptoms, NTG use, and when to call 911 or MD.  Also discussed weather considerations and indoor options.  Pt voiced understanding. Angie has been doing well in rehab.  She is now up to 5.1 METs on the XR.  We will continue to monitor her progress. Angie continues to do well in rehab.  She is consistently getting over 4 METs on the NuStep.  We will continue to monitor her progression. Angie started intervals today on the XR and NuStep!!   Row Name 08/05/16 1036 08/12/16 6237 08/27/16 0940       Exercise Comments MET levels reviewed today. Reviewed METs average and discussed progression with pt today. Sibbie graduated today from cardiac rehab with 36 sessions completed.  Details of the patient's exercise prescription and what She needs to do in order to continue the  prescription and progress were discussed with patient.  Patient was given a copy of prescription and goals.  Patient verbalized understanding.  Marylu plans to continue to exercise by joining an exercise program either at the [facility] or [facility]. She is also on the wait list for the am forever fit class.        Exercise Goals and Review:   Exercise Goals Re-Evaluation :     Exercise Goals Re-Evaluation    Row Name 08/11/16 1535 08/12/16 1033 08/25/16 1035         Exercise Goal Re-Evaluation   Exercise Goals Review Increase Physical Activity;Increase Strenth and Stamina Increase Physical Activity;Increase Strenth and Stamina Increase Physical Activity;Increase Strenth and Stamina     Comments Angie has been enjoying her intervals on the NuStep and XR!  She is now almost up to 5 METs on the NuStep.  We will continue to monitor her progression.  She is also one of winners in the rehab Olympics surpassing many others that are younger than her!!  She has said that this was the best thing to happen to her to help her make lifestyle changes! Angie improved her walk test by 22.8%!! Janace Hoard will be graduating on Friday!  She has done great in classes and has said this has helped get back to where she was and better!     Expected Outcomes Short: Janace Hoard will continue to come to rehab classes and will be doing her post walk soon.  Long: Janace Hoard will maintain what she has gained in rehab and continue to improve upon it.  - Angie will continue to exercise on her own with yoga and classes at the senior center.        Discharge Exercise Prescription (Final Exercise Prescription Changes):     Exercise Prescription Changes - 08/25/16 1000      Response to Exercise   Blood Pressure (Admit) 118/60   Blood Pressure (Exercise) 170/76   Blood Pressure (Exit) 120/68   Heart Rate (Admit) 88  bpm   Heart Rate (Exercise) 127 bpm   Heart Rate (Exit) 80 bpm   Rating of Perceived Exertion  (Exercise) 15   Symptoms none   Duration Continue with 45 min of aerobic exercise without signs/symptoms of physical distress.   Intensity THRR unchanged     Progression   Progression Continue to progress workloads to maintain intensity without signs/symptoms of physical distress.   Average METs 6.61     Resistance Training   Training Prescription Yes   Weight 3 lbs   Reps 10-15     Interval Training   Interval Training Yes   Equipment NuStep;REL-XR   Comments 2 min off 30 sec on     Treadmill   MPH 3.5   Grade 3   Minutes 15   METs 5.13     NuStep   Level 4   Minutes 15   METs 5.3     REL-XR   Level 4   Minutes 15   METs 9.4     Home Exercise Plan   Plans to continue exercise at Home (comment)  walking and yoga   Frequency Add 3 additional days to program exercise sessions.   Initial Home Exercises Provided 07/08/16      Nutrition:  Target Goals: Understanding of nutrition guidelines, daily intake of sodium <1550m, cholesterol <2067m calories 30% from fat and 7% or less from saturated fats, daily to have 5 or more servings of fruits and vegetables.  Biometrics:     Pre Biometrics - 06/21/16 1622      Pre Biometrics   Height 5' 4.8" (1.646 m)   Weight 133 lb (60.3 kg)   Waist Circumference 28 inches   Hip Circumference 36 inches   Waist to Hip Ratio 0.78 %   BMI (Calculated) 22.3   Single Leg Stand 30 seconds         Post Biometrics - 08/12/16 1114       Post  Biometrics   Height 5' 4.8" (1.646 m)   Weight 136 lb (61.7 kg)   Waist Circumference 29 inches   Hip Circumference 37 inches   Waist to Hip Ratio 0.78 %   BMI (Calculated) 22.8   Single Leg Stand 30 seconds      Nutrition Therapy Plan and Nutrition Goals:     Nutrition Therapy & Goals - 08/20/16 0822      Nutrition Therapy   RD appointment defered Yes      Nutrition Discharge: Rate Your Plate Scores:     Nutrition Assessments - 08/19/16 1041      Rate Your Plate  Scores   Post Score 73      Nutrition Goals Re-Evaluation:     Nutrition Goals Re-Evaluation    Row Name 08/20/16 0823             Goals   Comment Anje said she knows what to eat and her weight is well maintained. Her blood pressure is good now with what she is eating.        Expected Outcome Heart healthy eating.          Nutrition Goals Discharge (Final Nutrition Goals Re-Evaluation):     Nutrition Goals Re-Evaluation - 08/20/16 0823      Goals   Comment Anje said she knows what to eat and her weight is well maintained. Her blood pressure is good now with what she is eating.    Expected Outcome Heart healthy eating.  Psychosocial: Target Goals: Acknowledge presence or absence of significant depression and/or stress, maximize coping skills, provide positive support system. Participant is able to verbalize types and ability to use techniques and skills needed for reducing stress and depression.   Initial Review & Psychosocial Screening:     Initial Psych Review & Screening - 06/21/16 1457      Initial Review   Current issues with --  None reported     Family Dynamics   Good Support System? Yes     Barriers   Psychosocial barriers to participate in program There are no identifiable barriers or psychosocial needs.;The patient should benefit from training in stress management and relaxation.     Screening Interventions   Interventions Encouraged to exercise      Quality of Life Scores:      Quality of Life - 08/19/16 1041      Quality of Life Scores   Health/Function Post 27.86 %   Socioeconomic Post 29.25 %   Psych/Spiritual Post 24.86 %   Family Post 30 %   GLOBAL Post 27.82 %      PHQ-9: Recent Review Flowsheet Data    Depression screen [facility] 2/9 06/21/2016   Decreased Interest 0   Down, Depressed, Hopeless 0   PHQ - 2 Score 0   Altered sleeping 0   Tired, decreased energy 0   Change in appetite 0   Feeling bad or failure about yourself   0   Trouble concentrating 0   Moving slowly or fidgety/restless 0   Suicidal thoughts 0   PHQ-9 Score 0     Interpretation of Total Score  Total Score Depression Severity:  1-4 = Minimal depression, 5-9 = Mild depression, 10-14 = Moderate depression, 15-19 = Moderately severe depression, 20-27 = Severe depression   Psychosocial Evaluation and Intervention:     Psychosocial Evaluation - 07/16/16 0854      Psychosocial Evaluation & Interventions   Comments Spoke with Minie today.  She stated her stress is more about her blood pressure reading. She has had a few reading at home that are well above the norm. Her next appointment with her physician is in March.  Advised her to call her physician and review her BP readings and her concern of the stress she is experiencing because of  the BP readings. She has not needed to take any anxiety meds since she was in the hospital.     Will follow up with Tesneem to see if she spoke with her physycian about her concerns.  Goal is stress reduction and better blood pressure contol      Psychosocial Re-Evaluation:     Psychosocial Re-Evaluation    Row Name 07/22/16 0932 08/13/16 0829 08/20/16 0277         Psychosocial Re-Evaluation   Comments Follow up with Angie today reporting her blood pressure seemed related to her increased anxiety symptoms, so her Dr. Ricard Dillon her Blood pressure medication this past Monday 2/12.  She is hoping this will help - although last night her BP was up again; which she states just "wears me out."  She reports otherwise this program has been helpful and she particularly enjoyed the stress management education recently.  Staff will continue to follow with Angie concerning these matters. Yamili states today that her stress is better,since she started her new BP medication. She is working on continuing to exercise 30 min on treadmill when not in class ans does yoga same days. Stress much improved  since new medication started.  Lequisha said she feels like she has more energy since starting Cardiac Rehab and she said even her son mentioned that. Eller said her stress is lower since her MD started her on something else for her blood pressure and it is working good. She also reports that she used to feel like she was going to pass out even going grocery shopping in [place] before her heart procedure but she is doing well now     Expected Outcomes  - Stress levles remain decreased asn Kimbly recognizes if stress increase to be able to take action to overcome stress Cont lower stress levels.     Continue Psychosocial Services   - Follow up required by staff No Follow up required        Psychosocial Discharge (Final Psychosocial Re-Evaluation):     Psychosocial Re-Evaluation - 08/20/16 3335      Psychosocial Re-Evaluation   Comments Amiyah said she feels like she has more energy since starting Cardiac Rehab and she said even her son mentioned that. Shahira said her stress is lower since her MD started her on something else for her blood pressure and it is working good. She also reports that she used to feel like she was going to pass out even going grocery shopping in [place] before her heart procedure but she is doing well now   Expected Outcomes Cont lower stress levels.   Continue Psychosocial Services  No Follow up required      Vocational Rehabilitation: Provide vocational rehab assistance to qualifying candidates.   Vocational Rehab Evaluation & Intervention:     Vocational Rehab - 06/21/16 1459      Initial Vocational Rehab Evaluation & Intervention   Assessment shows need for Vocational Rehabilitation No      Education: Education Goals: Education classes will be provided on a weekly basis, covering required topics. Participant will state understanding/return demonstration of topics presented.  Learning Barriers/Preferences:   Education Topics: General Nutrition Guidelines/Fats and  Fiber: -Group instruction provided by verbal, written material, models and posters to present the general guidelines for heart healthy nutrition. Gives an explanation and review of dietary fats and fiber.   Controlling Sodium/Reading Food Labels: -Group verbal and written material supporting the discussion of sodium use in heart healthy nutrition. Review and explanation with models, verbal and written materials for utilization of the food label.   Exercise Physiology & Risk Factors: - Group verbal and written instruction with models to review the exercise physiology of the cardiovascular system and associated critical values. Details cardiovascular disease risk factors and the goals associated with each risk factor.   Cardiac Rehab from 08/24/2016 in [facility] Cardiac and Pulmonary Rehab  Date  08/24/16  Educator  [facility]  Instruction Review Code  2- meets goals/outcomes      Aerobic Exercise & Resistance Training: - Gives group verbal and written discussion on the health impact of inactivity. On the components of aerobic and resistive training programs and the benefits of this training and how to safely progress through these programs.   Cardiac Rehab from 08/24/2016 in [facility] Cardiac and Pulmonary Rehab  Date  07/01/16  Educator  AS  Instruction Review Code  2- meets goals/outcomes      Flexibility, Balance, General Exercise Guidelines: - Provides group verbal and written instruction on the benefits of flexibility and balance training programs. Provides general exercise guidelines with specific guidelines to those with heart or lung disease. Demonstration and skill practice provided.  Cardiac Rehab from 08/24/2016 in [facility] Cardiac and Pulmonary Rehab  Date  07/06/16  Educator  [facility]  Instruction Review Code  2- meets goals/outcomes      Stress Management: - Provides group verbal and written instruction about the health risks of elevated stress, cause of high stress, and healthy ways to reduce  stress.   Cardiac Rehab from 08/24/2016 in [facility] Cardiac and Pulmonary Rehab  Date  07/15/16  Educator  TS  Instruction Review Code  2- meets goals/outcomes      Depression: - Provides group verbal and written instruction on the correlation between heart/lung disease and depressed mood, treatment options, and the stigmas associated with seeking treatment.   Cardiac Rehab from 08/24/2016 in [facility] Cardiac and Pulmonary Rehab  Date  08/12/16  Educator  TS  Instruction Review Code  2- meets goals/outcomes      Anatomy & Physiology of the Heart: - Group verbal and written instruction and models provide basic cardiac anatomy and physiology, with the coronary electrical and arterial systems. Review of: AMI, Angina, Valve disease, Heart Failure, Cardiac Arrhythmia, Pacemakers, and the ICD.   Cardiac Rehab from 08/24/2016 in [facility] Cardiac and Pulmonary Rehab  Date  07/13/16  Educator  CE  Instruction Review Code  2- meets goals/outcomes      Cardiac Procedures: - Group verbal and written instruction and models to describe the testing methods done to diagnose heart disease. Reviews the outcomes of the test results. Describes the treatment choices: Medical Management, Angioplasty, or Coronary Bypass Surgery.   Cardiac Rehab from 08/24/2016 in [facility] Cardiac and Pulmonary Rehab  Date  07/20/16  Educator  CE  Instruction Review Code  2- meets goals/outcomes      Cardiac Medications: - Group verbal and written instruction to review commonly prescribed medications for heart disease. Reviews the medication, class of the drug, and side effects. Includes the steps to properly store meds and maintain the prescription regimen.   Cardiac Rehab from 08/24/2016 in [facility] Cardiac and Pulmonary Rehab  Date  07/27/16 [08/05/16 Cardiac Meds II]  Educator  SB  Instruction Review Code  2- meets goals/outcomes      Go Sex-Intimacy & Heart Disease, Get SMART - Goal Setting: - Group verbal and written instruction  through game format to discuss heart disease and the return to sexual intimacy. Provides group verbal and written material to discuss and apply goal setting through the application of the S.M.A.R.T. Method.   Cardiac Rehab from 08/24/2016 in [facility] Cardiac and Pulmonary Rehab  Date  07/20/16  Educator  CE  Instruction Review Code  2- meets goals/outcomes      Other Matters of the Heart: - Provides group verbal, written materials and models to describe Heart Failure, Angina, Valve Disease, and Diabetes in the realm of heart disease. Includes description of the disease process and treatment options available to the cardiac patient.   Cardiac Rehab from 08/24/2016 in [facility] Cardiac and Pulmonary Rehab  Date  07/13/16  Educator  CE  Instruction Review Code  2- meets goals/outcomes      Exercise & Equipment Safety: - Individual verbal instruction and demonstration of equipment use and safety with use of the equipment.   Cardiac Rehab from 08/24/2016 in [facility] Cardiac and Pulmonary Rehab  Date  06/21/16  Educator  C.   Instruction Review Code  2- meets goals/outcomes      Infection Prevention: - Provides verbal and written material to individual with discussion of infection control including proper hand washing  and proper equipment cleaning during exercise session.   Cardiac Rehab from 08/24/2016 in [facility] Cardiac and Pulmonary Rehab  Date  06/21/16  Educator  sb  Instruction Review Code  2- meets goals/outcomes      Falls Prevention: - Provides verbal and written material to individual with discussion of falls prevention and safety.   Cardiac Rehab from 08/24/2016 in [facility] Cardiac and Pulmonary Rehab  Date  06/21/16  Educator  sb  Instruction Review Code  2- meets goals/outcomes      Diabetes: - Individual verbal and written instruction to review signs/symptoms of diabetes, desired ranges of glucose level fasting, after meals and with exercise. Advice that pre and post exercise glucose  checks will be done for 3 sessions at entry of program.    Knowledge Questionnaire Score:     Knowledge Questionnaire Score - 08/19/16 1030      Knowledge Questionnaire Score   Post Score 26/28      Core Components/Risk Factors/Patient Goals at Admission:     Personal Goals and Risk Factors at Admission - 07/16/16 0816      Core Components/Risk Factors/Patient Goals on Admission   Intervention Offer individual and/or small group education and counseling on adjustment to heart disease, stress management and health-related lifestyle change. Teach and support self-help strategies.;Refer participants experiencing significant psychosocial distress to appropriate mental health specialists for further evaluation and treatment. When possible, include family members and significant others in education/counseling sessions.      Core Components/Risk Factors/Patient Goals Review:      Goals and Risk Factor Review    Row Name 07/16/16 0859 08/13/16 0825 08/20/16 0824         Core Components/Risk Factors/Patient Goals Review   Personal Goals Review Stress;Hypertension Lipids;Hypertension;Stress  -     Review Andilyn reports high blopod pressure readings at home.  She will bring her cuff to  compare our readaings with her cuff.  She will call her physician to review her readings and how she is stressed over her blood pressure being higher than normal Zoiey has seen her doctor and has a new blood pressure medication with great response lowering her readings. Her stress is absolutely better since her BP is controlled.  Blood pressure is good and her stress level is lower      Expected Outcomes Blood pressure will be controlled and stress will be decreased as she gains better control over blood pressure. Blood pressure will maintain controlled with new medication and stress will continue to be decreased as she gains better control over blood pressure. Heart healthy lifestyle.        Core  Components/Risk Factors/Patient Goals at Discharge (Final Review):      Goals and Risk Factor Review - 08/20/16 0824      Core Components/Risk Factors/Patient Goals Review   Review Blood pressure is good and her stress level is lower    Expected Outcomes Heart healthy lifestyle.      ITP Comments:     ITP Comments    Row Name 06/21/16 1444 07/07/16 0620 08/04/16 0606 08/20/16 0819     ITP Comments Medical review completed. Initial ITP created. Documentation of diagnosis can be found CARE [facility]WHERE [facility] surgery 04/26/2016 30 day review. Continue with ITP unless directed changes per Medical Director review. New to program 30 day review. Continue with ITP unless directed changes per Medical Director review Mega said she feels like she has more energy since starting Cardiac Rehab and she said even her son  mentioned that. Tacori said her stress is lower since her MD started her on something else for her blood pressure and it is working good. She also reports that she used to feel like she was going to pass out even going grocery shopping in [place] before her heart procedure but she is doing well now.       Comments: Discharge ITP

## 2016-08-27 NOTE — Progress Notes (Signed)
Daily Session Note  Patient Details  Name: Sheila Osborne MRN: [id number] Date of Birth: 11-20-1935 Referring Provider:     Cardiac Rehab from 06/21/2016 in [facility] Cardiac and Pulmonary Rehab  Referring Provider  Bartholome Bill MD      Encounter Date: 08/27/2016  Check In:     Session Check In - 08/27/16 0934      Check-In   Location [facility]-Cardiac & Pulmonary Rehab   Staff Present Nyoka Cowden, RN, BSN, Francene Boyers, DPT, CEEA;Other  Darel Hong, RN BSN   Supervising physician immediately available to respond to emergencies See telemetry face sheet for immediately available ER MD   Medication changes reported     No   Fall or balance concerns reported    No   Tobacco Cessation No Change   Warm-up and Cool-down Performed on first and last piece of equipment   Resistance Training Performed Yes   VAD Patient? No     Pain Assessment   Currently in Pain? No/denies   Pain Score 0-No pain   Multiple Pain Sites No         History  Smoking Status  . Never Smoker  Smokeless Tobacco  . Never Used    Goals Met:  Independence with exercise equipment Exercise tolerated well No report of cardiac concerns or symptoms Strength training completed today  Goals Unmet:  Not Applicable  Comments:  Aulani graduated today from cardiac rehab with 36 sessions completed.  Details of the patient's exercise prescription and what She needs to do in order to continue the prescription and progress were discussed with patient.  Patient was given a copy of prescription and goals.  Patient verbalized understanding.  Richardine plans to continue to exercise by joining an exercise program either at the [facility] or [facility] fitness center. She is also on the wait list for the am forever fit class.    Dr. Emily Filbert is Medical Director for [facility].

## 2017-05-19 ENCOUNTER — Other Ambulatory Visit: Payer: Self-pay

## 2017-05-19 DIAGNOSIS — Z1231 Encounter for screening mammogram for malignant neoplasm of breast: Secondary | ICD-10-CM

## 2017-07-19 ENCOUNTER — Ambulatory Visit
Admission: RE | Admit: 2017-07-19 | Discharge: 2017-07-19 | Disposition: A | Discharge status: Home / Self Care | Payer: Medicare Other | Source: Ambulatory Visit | Attending: General Surgery | Admitting: General Surgery

## 2017-07-19 DIAGNOSIS — Z1231 Encounter for screening mammogram for malignant neoplasm of breast: Secondary | ICD-10-CM | POA: Diagnosis present

## 2017-07-21 ENCOUNTER — Ambulatory Visit: Payer: Self-pay | Admitting: General Surgery

## 2017-07-26 ENCOUNTER — Ambulatory Visit (INDEPENDENT_AMBULATORY_CARE_PROVIDER_SITE_OTHER): Payer: Medicare Other | Admitting: General Surgery

## 2017-07-26 ENCOUNTER — Encounter: Payer: Self-pay | Admitting: General Surgery

## 2017-07-26 VITALS — BP 136/64 | HR 59 | Resp 12 | Ht 65.0 in | Wt 138.0 lb

## 2017-07-26 DIAGNOSIS — R928 Other abnormal and inconclusive findings on diagnostic imaging of breast: Secondary | ICD-10-CM

## 2017-07-26 DIAGNOSIS — Z1231 Encounter for screening mammogram for malignant neoplasm of breast: Secondary | ICD-10-CM

## 2017-07-26 DIAGNOSIS — Z1239 Encounter for other screening for malignant neoplasm of breast: Secondary | ICD-10-CM

## 2017-07-26 NOTE — Progress Notes (Signed)
Patient ID: Sheila Osborne, female   DOB: Dec 12, 1935, 82 y.o.   MRN: [id number]  Chief Complaint  Patient presents with  . Follow-up    HPI Sheila Osborne is a 82 y.o. female.  who presents for a breast evaluation. The most recent mammogram was done on 07-20-17.  Patient does perform regular self breast checks and gets regular mammograms done.   No new breast issues.  HPI  Past Medical History:  Diagnosis Date  . Carotid bruit   . Hypertension   . Syncope 2012    Past Surgical History:  Procedure Laterality Date  . AORTIC VALVE REPLACEMENT  04/26/2016  . BREAST BIOPSY Left 1997   core- neg  . CARDIAC CATHETERIZATION Bilateral 02/13/2016   Procedure: Right/Left Heart Cath and Coronary Angiography;  Surgeon: Teodoro Spray, MD;  Location: [place] [facility];  Service: Cardiovascular;  Laterality: Bilateral;  . CARDIAC VALVE REPLACEMENT    . CATARACT EXTRACTION  2014   x2  . COLONOSCOPY  2006  . COLONOSCOPY WITH PROPOFOL N/A 08/25/2016   Procedure: COLONOSCOPY WITH PROPOFOL;  Surgeon: Robert Bellow, MD;  Location: [facility] ENDOSCOPY;  Service: Endoscopy;  Laterality: N/A;    Family History  Problem Relation Age of Onset  . Cancer Father        stomach  . Breast cancer Neg Hx     Social History Social History   Tobacco Use  . Smoking status: Never Smoker  . Smokeless tobacco: Never Used  Substance Use Topics  . Alcohol use: Yes    Alcohol/week: 0.0 oz    Comment: 1/day  . Drug use: No    No Known Allergies  Current Outpatient Medications  Medication Sig Dispense Refill  . amLODipine (NORVASC) 10 MG tablet Take 10 mg by mouth daily.    Marland Kitchen aspirin 325 MG tablet Take 325 mg by mouth daily.    . Ergocalciferol (VITAMIN D2 PO) Take 1.25 mg by mouth once a week.    . vitamin C (ASCORBIC ACID) 500 MG tablet Take 500 mg by mouth daily.    . metoprolol tartrate (LOPRESSOR) 12.5 mg TABS tablet Take by mouth.     No current facility-administered  medications for this visit.     Review of Systems Review of Systems  Constitutional: Negative.   Respiratory: Negative.   Cardiovascular: Negative.     Blood pressure 136/64, pulse (!) 59, resp. rate 12, height 5\' 5"  (1.651 m), weight 138 lb (62.6 kg).  Physical Exam Physical Exam  Constitutional: She is oriented to person, place, and time. She appears well-developed and well-nourished.  HENT:  Mouth/Throat: Oropharynx is clear and moist.  Eyes: Conjunctivae are normal. No scleral icterus.  Neck: Neck supple.  Cardiovascular: Normal rate and regular rhythm.  Murmur heard.  Systolic murmur is present with a grade of 1/6. Pulmonary/Chest: Effort normal and breath sounds normal. Right breast exhibits no inverted nipple, no mass, no nipple discharge, no skin change and no tenderness. Left breast exhibits no inverted nipple, no mass, no nipple discharge, no skin change and no tenderness.  Lymphadenopathy:    She has no cervical adenopathy.    She has no axillary adenopathy.  Neurological: She is alert and oriented to person, place, and time.  Skin: Skin is warm and dry.  Psychiatric: Her behavior is normal.    Data Reviewed Screening mammogram of July 19, 2017 reviewed.  BI-RADS-1.  Assessment    Benign breast exam.    Plan  Patient will be asked to return to the office in one year with a bilateral screening mammogram.     HPI, Physical Exam, Assessment and Plan have been scribed under the direction and in the presence of Robert Bellow, MD. Karie Fetch, RN  I have completed the exam and reviewed the above documentation for accuracy and completeness.  I agree with the above.  Haematologist has been used and any errors in dictation or transcription are unintentional.  Hervey Ard, M.D., F.A.C.S.  [name] Juanantonio Stolar 07/27/2017, 8:16 PM

## 2017-07-26 NOTE — Patient Instructions (Addendum)
The patient is aware to call back for any questions or new concerns. Patient will be asked to return to the office in one year with a bilateral screening mammogram.  

## 2018-03-09 DIAGNOSIS — I10 Essential (primary) hypertension: Secondary | ICD-10-CM | POA: Insufficient documentation

## 2018-06-06 ENCOUNTER — Other Ambulatory Visit: Payer: Self-pay

## 2018-06-06 DIAGNOSIS — Z1231 Encounter for screening mammogram for malignant neoplasm of breast: Secondary | ICD-10-CM

## 2018-07-20 ENCOUNTER — Ambulatory Visit
Admission: RE | Admit: 2018-07-20 | Discharge: 2018-07-20 | Disposition: A | Discharge status: Home / Self Care | Payer: Medicare Other | Source: Ambulatory Visit | Attending: Surgery | Admitting: Surgery

## 2018-07-20 DIAGNOSIS — Z1231 Encounter for screening mammogram for malignant neoplasm of breast: Secondary | ICD-10-CM | POA: Diagnosis present

## 2018-08-01 ENCOUNTER — Ambulatory Visit (INDEPENDENT_AMBULATORY_CARE_PROVIDER_SITE_OTHER): Payer: Medicare Other | Admitting: General Surgery

## 2018-08-01 ENCOUNTER — Other Ambulatory Visit: Payer: Self-pay

## 2018-08-01 ENCOUNTER — Encounter: Payer: Self-pay | Admitting: General Surgery

## 2018-08-01 VITALS — BP 147/85 | HR 66 | Temp 97.7°F | Resp 14 | Ht 65.0 in | Wt 139.6 lb

## 2018-08-01 DIAGNOSIS — Z1239 Encounter for other screening for malignant neoplasm of breast: Secondary | ICD-10-CM | POA: Diagnosis not present

## 2018-08-01 NOTE — Progress Notes (Signed)
Patient ID: Sheila Osborne, female   DOB: 28-Apr-1936, 83 y.o.   MRN: [id number]  Chief Complaint  Patient presents with  . Follow-up    1 yr f/u rec Bil Screen Mammo [facility] 07/20/18    HPI Sheila Osborne is a 83 y.o. female.  who presents for a breast evaluation. The most recent mammogram was done on 07-20-18. Patient does perform regular self breast checks and gets regular mammograms done.   No new breast issues.  HPI  Past Medical History:  Diagnosis Date  . Carotid bruit   . Hypertension   . Syncope 2012    Past Surgical History:  Procedure Laterality Date  . AORTIC VALVE REPLACEMENT  04/26/2016  . BREAST BIOPSY Left 1997   core- neg  . CARDIAC CATHETERIZATION Bilateral 02/13/2016   Procedure: Right/Left Heart Cath and Coronary Angiography;  Surgeon: Teodoro Spray, MD;  Location: [place] [facility];  Service: Cardiovascular;  Laterality: Bilateral;  . CARDIAC VALVE REPLACEMENT    . CATARACT EXTRACTION  2014   x2  . COLONOSCOPY  2006  . COLONOSCOPY WITH PROPOFOL N/A 08/25/2016   Procedure: COLONOSCOPY WITH PROPOFOL;  Surgeon: Robert Bellow, MD;  Location: [facility] ENDOSCOPY;  Service: Endoscopy;  Laterality: N/A;    Family History  Problem Relation Age of Onset  . Cancer Father        stomach  . Breast cancer Neg Hx     Social History Social History   Tobacco Use  . Smoking status: Never Smoker  . Smokeless tobacco: Never Used  Substance Use Topics  . Alcohol use: Yes    Alcohol/week: 0.0 standard drinks    Comment: 1/day  . Drug use: No    No Known Allergies  Current Outpatient Medications  Medication Sig Dispense Refill  . ALPRAZolam (XANAX) 0.5 MG tablet Take 0.5 mg by mouth at bedtime as needed for anxiety.    Marland Kitchen amLODipine (NORVASC) 10 MG tablet Take 10 mg by mouth daily.    Marland Kitchen aspirin 325 MG tablet Take 325 mg by mouth daily.    . Ergocalciferol (VITAMIN D2 PO) Take 1.25 mg by mouth once a week.    . metoprolol tartrate  (LOPRESSOR) 12.5 mg TABS tablet Take 12.5 mg by mouth 2 (two) times daily.     . vitamin C (ASCORBIC ACID) 500 MG tablet Take 500 mg by mouth daily.     No current facility-administered medications for this visit.     Review of Systems Review of Systems  Constitutional: Negative.   Respiratory: Negative.  Negative for shortness of breath.   Cardiovascular: Negative.  Negative for chest pain, palpitations and leg swelling.    Blood pressure (!) 147/85, pulse 66, temperature 97.7 F (36.5 C), temperature source Temporal, resp. rate 14, height 5\' 5"  (1.651 m), weight 139 lb 9.6 oz (63.3 kg), SpO2 97 %.  Physical Exam Physical Exam Exam conducted with a chaperone present.  Constitutional:      Appearance: She is well-developed.  HENT:     Mouth/Throat:     Pharynx: No oropharyngeal exudate.  Eyes:     General: No scleral icterus.    Conjunctiva/sclera: Conjunctivae normal.  Neck:     Musculoskeletal: Neck supple.  Cardiovascular:     Rate and Rhythm: Normal rate and regular rhythm.     Heart sounds: Murmur present. Systolic murmur present with a grade of 1/6.  Pulmonary:     Effort: Pulmonary effort is normal.  Breath sounds: Normal breath sounds.  Chest:     Breasts:        Right: No inverted nipple, mass, nipple discharge, skin change or tenderness.        Left: No inverted nipple, mass, nipple discharge, skin change or tenderness.  Musculoskeletal:     Right lower leg: No edema.     Left lower leg: No edema.  Lymphadenopathy:     Cervical: No cervical adenopathy.  Skin:    General: Skin is warm and dry.  Neurological:     Mental Status: She is alert and oriented to person, place, and time.  Psychiatric:        Behavior: Behavior normal.     Data Reviewed Bilateral screening mammograms dated July 20, 2018 were reviewed.  BI-RADS-1.  Assessment Benign breast exam.  Plan  Patient desires to continue annual screening exams through this office.  The  patient is aware to call back for any questions or new concerns. Patient will be asked to return to the office in one year with a bilateral screening mammogram.   HPI, assessment, plan and physical exam has been scribed under the direction and in the presence of Robert Bellow, MD. Karie Fetch, RN  I have completed the exam and reviewed the above documentation for accuracy and completeness.  I agree with the above.  Haematologist has been used and any errors in dictation or transcription are unintentional.  Hervey Ard, M.D., F.A.C.S.  [name] Sheila Osborne 08/02/2018, 11:17 AM

## 2018-08-01 NOTE — Patient Instructions (Signed)
The patient is aware to call back for any questions or new concerns. Patient will be asked to return to the office in one year with a bilateral screening mammogram.  

## 2019-01-02 ENCOUNTER — Encounter: Payer: Self-pay | Admitting: General Surgery

## 2019-06-06 ENCOUNTER — Other Ambulatory Visit: Payer: Self-pay | Admitting: General Surgery

## 2019-06-06 DIAGNOSIS — Z1231 Encounter for screening mammogram for malignant neoplasm of breast: Secondary | ICD-10-CM

## 2019-07-09 DIAGNOSIS — I359 Nonrheumatic aortic valve disorder, unspecified: Secondary | ICD-10-CM | POA: Insufficient documentation

## 2019-07-23 ENCOUNTER — Ambulatory Visit
Admission: RE | Admit: 2019-07-23 | Discharge: 2019-07-23 | Disposition: A | Discharge status: Home / Self Care | Payer: Medicare Other | Source: Ambulatory Visit | Attending: General Surgery | Admitting: General Surgery

## 2019-07-23 DIAGNOSIS — Z1231 Encounter for screening mammogram for malignant neoplasm of breast: Secondary | ICD-10-CM | POA: Insufficient documentation

## 2019-08-28 ENCOUNTER — Encounter: Payer: Self-pay | Admitting: Internal Medicine

## 2019-08-28 ENCOUNTER — Other Ambulatory Visit: Payer: Self-pay

## 2019-08-28 ENCOUNTER — Ambulatory Visit (INDEPENDENT_AMBULATORY_CARE_PROVIDER_SITE_OTHER): Payer: Medicare Other | Admitting: Internal Medicine

## 2019-08-28 DIAGNOSIS — Z1239 Encounter for other screening for malignant neoplasm of breast: Secondary | ICD-10-CM | POA: Diagnosis not present

## 2019-08-28 DIAGNOSIS — I9789 Other postprocedural complications and disorders of the circulatory system, not elsewhere classified: Secondary | ICD-10-CM | POA: Diagnosis not present

## 2019-08-28 DIAGNOSIS — I4891 Unspecified atrial fibrillation: Secondary | ICD-10-CM

## 2019-08-28 DIAGNOSIS — I10 Essential (primary) hypertension: Secondary | ICD-10-CM

## 2019-08-28 DIAGNOSIS — Z952 Presence of prosthetic heart valve: Secondary | ICD-10-CM

## 2019-08-28 DIAGNOSIS — I359 Nonrheumatic aortic valve disorder, unspecified: Secondary | ICD-10-CM | POA: Diagnosis not present

## 2019-08-28 NOTE — Patient Instructions (Addendum)
So very good to meet you finally!  Talk with Dr Ubaldo Glassing about an alternative regimen for your blood pressure/atrial fibrillation:  cardizem CD (once daily). 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

## 2019-08-28 NOTE — Progress Notes (Addendum)
Subjective:  Patient ID: Sheila Osborne, female    DOB: [birth year]  Age: 84 y.o. MRN: [id number]  CC: Diagnoses of Breast screening, Aortic valve disease, Postoperative atrial fibrillation ([place]), Hypertension, essential, and S/P AVR were pertinent to this visit.  HPI Sheila Osborne presents for establishment of care  This visit occurred during the SARS-CoV-2 public health emergency.  Safety protocols were in place, including screening questions prior to the visit, additional usage of staff PPE, and extensive cleaning of exam room while observing appropriate contact time as indicated for disinfecting solutions.   Sheila Osborne is an 84 yr old [place] born immigrant who is transferring care from another local provider.  She has no acute problems and feels generally well, but has been having symptoms of depression that she attributes to the use of metoprolol.  She has been taking metoprolol since 2017 when she had a congenital bicuspid aortic valve replacement that was complicated by periprocedural atrial fibrillation .  She notes feeling melancholy, unmotivated ,  and having a pessimistic outlook..  She is not suicidal and has no desire to end her life.  She acknowledges that the quality of her life is good and she considers herself in good health.  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  She is physically active and exercises regularly  but has noted a decreased tolerance for exercise since starting the medication.  She feels off balance often without true vertigo. She uses alprazolam for occasional insomnia, but denies anxiety  Participates in yoga,  You tube senior exercise prorgam 4-5 times per week   45 minutes.  Yoga first .  Some OA in feet managed with walking.  No trouble sleeping.  Some constipation  Managed with occasional prunes,  No urinary incontinence  One son ,  Lives in [place].  Face times daily for an hour with her sister daily who lives in [place].   She recalls similar symptoms when she was prescribed metoprolol in her 40's to control migraines   2) she has been receiving Reclast injections annually from her Prior PCP with no known diagnosis of osteoporosis.  Records are unavailable at time of dictation for confirmation of diagnosis     No more colonoscopies and mammograms.    History Kelbi has a past medical history of Allergy, Arthritis, Carotid bruit, Heart murmur, Hyperlipidemia, Hypertension, and Syncope (2012).   She has a past surgical history that includes Colonoscopy (2006); Cataract extraction (2014); Cardiac catheterization (Bilateral, 02/13/2016); Aortic valve replacement (04/26/2016); Cardiac valve replacement; Colonoscopy with propofol (N/A, 08/25/2016); and Breast biopsy (Left, 1997).   Her family history includes Cancer in her father and paternal grandfather; Early death in her father and paternal grandfather; Hyperlipidemia in her mother; Hypertension in her mother, sister, and sister; Stroke in her maternal grandfather and mother.She reports that she has never smoked. She has never used smokeless tobacco. She reports current alcohol use. She reports that she does not use drugs.  Outpatient Medications Prior to Visit  Medication Sig Dispense Refill  . aspirin 325 MG tablet Take 325 mg by mouth daily.    . Ergocalciferol (VITAMIN D2 PO) Take 1.25 mg by mouth once a week.    . furosemide (LASIX) 20 MG tablet Take by mouth.    . vitamin C (ASCORBIC ACID) 500 MG tablet Take 500 mg by mouth daily.    Marland Kitchen ALPRAZolam (XANAX) 0.25 MG tablet Take 0.25 mg by mouth 2 (two) times daily as needed.    Marland Kitchen  amLODipine (NORVASC) 5 MG tablet Take 5 mg by mouth daily. Pt takes 5 mg in the am and 5 mg in the pm    . metoprolol tartrate (LOPRESSOR) 12.5 mg TABS tablet Take 12.5 mg by mouth 2 (two) times daily.     . zoledronic acid (RECLAST) 5 MG/100ML SOLN injection     . ALPRAZolam (XANAX) 0.5 MG tablet Take 0.5 mg by mouth at bedtime as needed  for anxiety.    Marland Kitchen amLODipine (NORVASC) 10 MG tablet Take 10 mg by mouth daily.     No facility-administered medications prior to visit.    Review of Systems:  Patient denies headache, fevers, malaise, unintentional weight loss, skin rash, eye pain, sinus congestion and sinus pain, sore throat, dysphagia,  hemoptysis , cough, dyspnea, wheezing, chest pain, palpitations, orthopnea, edema, abdominal pain, nausea, melena, diarrhea, constipation, flank pain, dysuria, hematuria, urinary  Frequency, nocturia, numbness, tingling, seizures,  Focal weakness, Loss of consciousness,  Tremor, insomnia, anxiety, and suicidal ideation.     Objective:  BP 134/80 (BP Location: Left Arm, Patient Position: Sitting, Cuff Size: Normal)   Pulse 69   Temp (!) 95.6 F (35.3 C) (Temporal)   Resp 16   Ht 5\' 5"  (1.651 m)   Wt 139 lb 6.4 oz (63.2 kg)   SpO2 97%   BMI 23.20 kg/m   Physical Exam:  General appearance: alert, cooperative and appears stated age Ears: normal TM's and external ear canals both ears Throat: lips, mucosa, and tongue normal; teeth and gums normal Neck: no adenopathy, no carotid bruit, supple, symmetrical, trachea midline and thyroid not enlarged, symmetric, no tenderness/mass/nodules Back: symmetric, no curvature. ROM normal. No CVA tenderness. Lungs: clear to auscultation bilaterally Heart: bradycardic, regular rhythm, S1, S2 normal, no murmur, click, rub or gallop Abdomen: soft, non-tender; bowel sounds normal; no masses,  no organomegaly Pulses: 2+ and symmetric Skin: Skin color, texture, turgor normal. No rashes or lesions Lymph nodes: Cervical, supraclavicular, and axillary nodes normal.  Assessment & Plan:   Problem List Items Addressed This Visit      Unprioritized   Aortic valve disease    Congenitally bicuspid valve,  S/p AV in 2017       Relevant Medications   furosemide (LASIX) 20 MG tablet   Breast screening    She is contemplating stopping breast cancer  screening. Discussed rationale for continued screening vs stopping       Hypertension, essential    Currently managed with amlodipine and metoprolol       Relevant Medications   furosemide (LASIX) 20 MG tablet   Postoperative atrial fibrillation (HCC)    She has been in sinus rhythm for years and would like to consider stopping metoprolol given the effect on her mental health.  Advised to discuss with Dr Ubaldo Glassing the possibility of changing to cardizem, which would necessitate a change from amlodipine to ARB for management of hypertension       Relevant Medications   furosemide (LASIX) 20 MG tablet   S/P AVR      I have discontinued Zhania B. Breach's amLODipine and ALPRAZolam. I am also having her maintain her Ergocalciferol (VITAMIN D2 PO), aspirin, vitamin C, and furosemide.  No orders of the defined types were placed in this encounter.   Medications Discontinued During This Encounter  Medication Reason  . ALPRAZolam (XANAX) 0.5 MG tablet Change in therapy  . amLODipine (NORVASC) 10 MG tablet Change in therapy    Follow-up: Return in about 10 months (  around 06/29/2020).   Crecencio Mc, MD

## 2019-08-31 ENCOUNTER — Encounter: Payer: Self-pay | Admitting: Internal Medicine

## 2019-08-31 NOTE — Assessment & Plan Note (Signed)
Congenitally bicuspid valve,  S/p AV in 2017  

## 2019-08-31 NOTE — Assessment & Plan Note (Signed)
She is contemplating stopping breast cancer screening. Discussed rationale for continued screening vs stopping

## 2019-08-31 NOTE — Assessment & Plan Note (Signed)
Currently managed with amlodipine and metoprolol

## 2019-08-31 NOTE — Assessment & Plan Note (Signed)
She has been in sinus rhythm for years and would like to consider stopping metoprolol given the effect on her mental health.  Advised to discuss with Dr Ubaldo Glassing the possibility of changing to cardizem, which would necessitate a change from amlodipine to ARB for management of hypertension

## 2019-09-20 ENCOUNTER — Encounter: Payer: Self-pay | Admitting: Internal Medicine

## 2019-12-24 ENCOUNTER — Telehealth: Payer: Self-pay | Admitting: Internal Medicine

## 2019-12-24 ENCOUNTER — Encounter: Payer: Self-pay | Admitting: Internal Medicine

## 2019-12-24 DIAGNOSIS — E559 Vitamin D deficiency, unspecified: Secondary | ICD-10-CM

## 2019-12-24 DIAGNOSIS — I10 Essential (primary) hypertension: Secondary | ICD-10-CM

## 2019-12-24 NOTE — Telephone Encounter (Signed)
Spoke with pt and she has been scheduled for tomorrow at 8:45 for fasting labs.

## 2019-12-24 NOTE — Telephone Encounter (Signed)
Pt needs a refill on medications but states that Dr Derrel Nip has never filled her meds? Please advise

## 2019-12-24 NOTE — Telephone Encounter (Signed)
Pt is requesting a refill on the megadose of vitamin D. Pt stated that if this is refilled to send it as a 90 day tp [facility].   Last OV:08/28/2019 Next OV: 07/02/2020

## 2019-12-24 NOTE — Addendum Note (Signed)
Addended by: Crecencio Mc on: 12/24/2019 11:10 AM   Modules accepted: Orders

## 2019-12-24 NOTE — Telephone Encounter (Signed)
No refills on megadose until level can be checked.  Needs fasting labs too so all have been ordered  Please set up lab appt for patient

## 2019-12-24 NOTE — Telephone Encounter (Signed)
according to her note she was taken off medication and changed. I do not see what medication was sent as a replacement.

## 2019-12-25 ENCOUNTER — Other Ambulatory Visit: Payer: Self-pay

## 2019-12-25 ENCOUNTER — Other Ambulatory Visit (INDEPENDENT_AMBULATORY_CARE_PROVIDER_SITE_OTHER): Payer: Medicare Other

## 2019-12-25 DIAGNOSIS — E559 Vitamin D deficiency, unspecified: Secondary | ICD-10-CM

## 2019-12-25 DIAGNOSIS — I10 Essential (primary) hypertension: Secondary | ICD-10-CM

## 2019-12-25 LAB — LIPID PANEL
Cholesterol: 236 mg/dL — ABNORMAL HIGH (ref 0–200)
HDL: 104.3 mg/dL (ref >39.00)
LDL Cholesterol: 117 mg/dL — ABNORMAL HIGH (ref 0–99)
NonHDL: 131.3
Total CHOL/HDL Ratio: 2
Triglycerides: 70 mg/dL (ref 0.0–149.0)
VLDL: 14 mg/dL (ref 0.0–40.0)

## 2019-12-25 LAB — COMPREHENSIVE METABOLIC PANEL
ALT: 11 U/L (ref 0–35)
AST: 17 U/L (ref 0–37)
Albumin: 4.1 g/dL (ref 3.5–5.2)
Alkaline Phosphatase: 45 U/L (ref 39–117)
BUN: 13 mg/dL (ref 6–23)
CO2: 33 mEq/L — ABNORMAL HIGH (ref 19–32)
Calcium: 9.4 mg/dL (ref 8.4–10.5)
Chloride: 102 mEq/L (ref 96–112)
Creatinine, Ser: 0.78 mg/dL (ref 0.40–1.20)
GFR: 70.77 mL/min (ref >60.00)
Glucose, Bld: 103 mg/dL — ABNORMAL HIGH (ref 70–99)
Potassium: 3.7 mEq/L (ref 3.5–5.1)
Sodium: 141 mEq/L (ref 135–145)
Total Bilirubin: 0.6 mg/dL (ref 0.2–1.2)
Total Protein: 6.7 g/dL (ref 6.0–8.3)

## 2019-12-25 LAB — VITAMIN D 25 HYDROXY (VIT D DEFICIENCY, FRACTURES): VITD: 60.77 ng/mL (ref 30.00–100.00)

## 2020-01-04 ENCOUNTER — Telehealth: Payer: Self-pay | Admitting: Internal Medicine

## 2020-01-04 NOTE — Telephone Encounter (Signed)
Pt called in need refill on her Vitamin D Dr.Tullo has never written the prescription for her

## 2020-01-07 NOTE — Telephone Encounter (Signed)
Last Vitamin d level was normal.

## 2020-04-04 ENCOUNTER — Ambulatory Visit (INDEPENDENT_AMBULATORY_CARE_PROVIDER_SITE_OTHER): Payer: Medicare Other | Admitting: Podiatry

## 2020-04-04 ENCOUNTER — Other Ambulatory Visit: Payer: Self-pay

## 2020-04-04 ENCOUNTER — Encounter: Payer: Self-pay | Admitting: Podiatry

## 2020-04-04 DIAGNOSIS — B351 Tinea unguium: Secondary | ICD-10-CM | POA: Diagnosis not present

## 2020-04-04 DIAGNOSIS — L600 Ingrowing nail: Secondary | ICD-10-CM | POA: Diagnosis not present

## 2020-04-04 NOTE — Patient Instructions (Signed)

## 2020-04-04 NOTE — Progress Notes (Signed)
   Subjective: Patient presents today for evaluation of pain to the medial border left great toe. Patient is concerned for possible ingrown nail. Patient presents today for further treatment and evaluation.  Past Medical History:  Diagnosis Date  . Allergy   . Arthritis   . Carotid bruit   . Heart murmur   . Hyperlipidemia   . Hypertension   . Syncope 2012    Objective:  General: Well developed, nourished, in no acute distress, alert and oriented x3   Dermatology: Skin is warm, dry and supple bilateral. Medial border left great toe appears to be erythematous with evidence of an ingrowing nail. Pain on palpation noted to the border of the nail fold. There is also some discoloration with dystrophy noted to the left hallux nail plate possibly concerning for onychomycosis of the nail plate. The remaining nails appear unremarkable at this time. There are no open sores, lesions.  Vascular: Dorsalis Pedis artery and Posterior Tibial artery pedal pulses palpable. No lower extremity edema noted.   Neruologic: Grossly intact via light touch bilateral.  Musculoskeletal: Muscular strength within normal limits in all groups bilateral. Normal range of motion noted to all pedal and ankle joints.   Assesement: #1 Paronychia with ingrowing nail medial border left great toe #2 Pain in toe #3 Onychomycosis of toenail left hallux  Plan of Care:  1. Patient evaluated.  2. Discussed treatment alternatives and plan of care. Explained nail avulsion procedure and post procedure course to patient. 3. Patient opted for permanent partial nail avulsion of the medial border left great toe.  4. Prior to procedure, local anesthesia infiltration utilized using 3 ml of a 50:50 mixture of 2% plain lidocaine and 0.5% plain marcaine in a normal hallux block fashion and a betadine prep performed.  5. Partial permanent nail avulsion with chemical matrixectomy performed using [id number] applications of phenol followed by  alcohol flush.  6. Light dressing applied. 7. Silvadene cream provided for the patient  8. OTC tolcylen antifungal topical provided apply daily  9. Return to clinic 2 weeks.  *Performing a Immunologist at [place] M. Jaymere Alen, [place] [facility]  Dr. Edrick Kins, [place] [place]                                        [place], [place] [address]                Office [phone]  Fax [phone]

## 2020-04-15 ENCOUNTER — Ambulatory Visit (INDEPENDENT_AMBULATORY_CARE_PROVIDER_SITE_OTHER): Payer: Medicare Other | Admitting: Podiatry

## 2020-04-15 ENCOUNTER — Other Ambulatory Visit: Payer: Self-pay

## 2020-04-15 DIAGNOSIS — L600 Ingrowing nail: Secondary | ICD-10-CM | POA: Diagnosis not present

## 2020-04-15 MED ORDER — DOXYCYCLINE HYCLATE 100 MG PO TABS: 100.0000 mg | ORAL_TABLET | Freq: Two times a day (BID) | ORAL | 0 refills | Status: DC

## 2020-04-15 NOTE — Progress Notes (Signed)
   Subjective: 84 y.o. female presents today status post permanent nail avulsion procedure of the medial border of the left great toe that was performed on 04/04/2020.  Patient states that she is doing very well and has no pain associated to the toe.  There continues to be some drainage and redness around the toe..   Past Medical History:  Diagnosis Date  . Allergy   . Arthritis   . Carotid bruit   . Heart murmur   . Hyperlipidemia   . Hypertension   . Syncope 2012    Objective: Skin is warm, dry and supple. Nail and respective nail fold appears to be healing appropriately. Open wound to the associated nail fold with a granular wound base and moderate amount of fibrotic tissue. Minimal drainage noted. Mild erythema around the periungual region likely due to phenol chemical matricectomy.  Assessment: #1 postop permanent partial nail avulsion medial border left great toe #2 open wound periungual nail fold of respective digit.   Plan of care: #1 patient was evaluated  #2 debridement of open wound was performed to the periungual border of the respective toe using a currette. Antibiotic ointment and Band-Aid was applied. #3  Prescription for doxycycline 100 mg 2 times daily #20  #4 patient is to return to clinic on a PRN basis.   Edrick Kins, DPM [facility]  Dr. Edrick Kins, [place]                                        [place],  [address]                Office [phone]  Fax [phone]

## 2020-06-12 ENCOUNTER — Other Ambulatory Visit: Payer: Self-pay | Admitting: General Surgery

## 2020-06-12 DIAGNOSIS — Z1231 Encounter for screening mammogram for malignant neoplasm of breast: Secondary | ICD-10-CM

## 2020-07-02 ENCOUNTER — Ambulatory Visit: Payer: Medicare Other | Admitting: Internal Medicine

## 2020-07-07 ENCOUNTER — Ambulatory Visit: Payer: Medicare Other | Admitting: Internal Medicine

## 2020-07-09 ENCOUNTER — Telehealth: Payer: Self-pay | Admitting: Internal Medicine

## 2020-07-09 NOTE — Telephone Encounter (Signed)
Left message for patient to call back and schedule Medicare Annual Wellness Visit (AWV)   This should be a virtual visit only=30 minutes.  Last AWV 07/09/19; please schedule at anytime with Denisa O'Brien-Blaney at [facility].

## 2020-07-15 ENCOUNTER — Ambulatory Visit (INDEPENDENT_AMBULATORY_CARE_PROVIDER_SITE_OTHER): Payer: Medicare Other

## 2020-07-15 VITALS — Ht 65.0 in | Wt 139.0 lb

## 2020-07-15 DIAGNOSIS — Z0000 Encounter for general adult medical examination without abnormal findings: Secondary | ICD-10-CM

## 2020-07-15 NOTE — Patient Instructions (Addendum)
[name] , Thank you for taking time to come for your Medicare Wellness Visit. I appreciate your ongoing commitment to your health goals. Please review the following plan we discussed and let me know if I can assist you in the future.   These are the goals we discussed: Goals      Patient Stated   .  Maintain healthy lifestyle (pt-stated)      Stay active Healthy diet       This is a list of the screening recommended for you and due dates:  Health Maintenance  Topic Date Due  . Tetanus Vaccine  Never done  . DEXA scan (bone density measurement)  Never done  . Pneumonia vaccines (1 of 2 - PCV13) Never done  . COVID-19 Vaccine (4 - Booster for Pfizer series) 09/15/2020  . Flu Shot  Completed   Immunizations Immunization History  Administered Date(s) Administered  . Influenza-Unspecified 03/27/2019  . PFIZER(Purple Top)SARS-COV-2 Vaccination 06/21/2019, 07/12/2019   Keep all routine maintenance appointments.   Follow up 07/17/20 @ 12:00.  Advanced directives: End of life planning; Advance aging; Advanced directives discussed.  Copy of current HCPOA/Living Will requested.    Conditions/risks identified: none new.  Follow up in one year for your annual wellness visit.   Preventive Care 7 Years and Older, Female Preventive care refers to lifestyle choices and visits with your health care provider that can promote health and wellness. What does preventive care include?  A yearly physical exam. This is also called an annual well check.  Dental exams once or twice a year.  Routine eye exams. Ask your health care provider how often you should have your eyes checked.  Personal lifestyle choices, including:  Daily care of your teeth and gums.  Regular physical activity.  Eating a healthy diet.  Avoiding tobacco and drug use.  Limiting alcohol use.  Practicing safe sex.  Taking low-dose aspirin every day.  Taking vitamin and mineral supplements as recommended  by your health care provider. What happens during an annual well check? The services and screenings done by your health care provider during your annual well check will depend on your age, overall health, lifestyle risk factors, and family history of disease. Counseling  Your health care provider may ask you questions about your:  Alcohol use.  Tobacco use.  Drug use.  Emotional well-being.  Home and relationship well-being.  Sexual activity.  Eating habits.  History of falls.  Memory and ability to understand (cognition).  Work and work Statistician.  Reproductive health. Screening  You may have the following tests or measurements:  Height, weight, and BMI.  Blood pressure.  Lipid and cholesterol levels. These may be checked every 5 years, or more frequently if you are over 34 years old.  Skin check.  Lung cancer screening. You may have this screening every year starting at age 74 if you have a 30-pack-year history of smoking and currently smoke or have quit within the past 15 years.  Fecal occult blood test (FOBT) of the stool. You may have this test every year starting at age 9.  Flexible sigmoidoscopy or colonoscopy. You may have a sigmoidoscopy every 5 years or a colonoscopy every 10 years starting at age 89.  Hepatitis C blood test.  Hepatitis B blood test.  Sexually transmitted disease (STD) testing.  Diabetes screening. This is done by checking your blood sugar (glucose) after you have not eaten for a while (fasting). You may have this done every 1-3  years.  Bone density scan. This is done to screen for osteoporosis. You may have this done starting at age 29.  Mammogram. This may be done every 1-2 years. Talk to your health care provider about how often you should have regular mammograms. Talk with your health care provider about your test results, treatment options, and if necessary, the need for more tests. Vaccines  Your health care provider may  recommend certain vaccines, such as:  Influenza vaccine. This is recommended every year.  Tetanus, diphtheria, and acellular pertussis (Tdap, Td) vaccine. You may need a Td booster every 10 years.  Zoster vaccine. You may need this after age 63.  Pneumococcal 13-valent conjugate (PCV13) vaccine. One dose is recommended after age 34.  Pneumococcal polysaccharide (PPSV23) vaccine. One dose is recommended after age 77. Talk to your health care provider about which screenings and vaccines you need and how often you need them. This information is not intended to replace advice given to you by your health care provider. Make sure you discuss any questions you have with your health care provider. Document Released: 06/20/2015 Document Revised: 02/11/2016 Document Reviewed: 03/25/2015 Elsevier Interactive Patient Education  2017 [place] Prevention in the Home Falls can cause injuries. They can happen to people of all ages. There are many things you can do to make your home safe and to help prevent falls. What can I do on the outside of my home?  Regularly fix the edges of walkways and driveways and fix any cracks.  Remove anything that might make you trip as you walk through a door, such as a raised step or threshold.  Trim any bushes or trees on the path to your home.  Use bright outdoor lighting.  Clear any walking paths of anything that might make someone trip, such as rocks or tools.  Regularly check to see if handrails are loose or broken. Make sure that both sides of any steps have handrails.  Any raised decks and porches should have guardrails on the edges.  Have any leaves, snow, or ice cleared regularly.  Use sand or salt on walking paths during winter.  Clean up any spills in your garage right away. This includes oil or grease spills. What can I do in the bathroom?  Use night lights.  Install grab bars by the toilet and in the tub and shower. Do not use towel  bars as grab bars.  Use non-skid mats or decals in the tub or shower.  If you need to sit down in the shower, use a plastic, non-slip stool.  Keep the floor dry. Clean up any water that spills on the floor as soon as it happens.  Remove soap buildup in the tub or shower regularly.  Attach bath mats securely with double-sided non-slip rug tape.  Do not have throw rugs and other things on the floor that can make you trip. What can I do in the bedroom?  Use night lights.  Make sure that you have a light by your bed that is easy to reach.  Do not use any sheets or blankets that are too big for your bed. They should not hang down onto the floor.  Have a firm chair that has side arms. You can use this for support while you get dressed.  Do not have throw rugs and other things on the floor that can make you trip. What can I do in the kitchen?  Clean up any spills right away.  Avoid walking on wet floors.  Keep items that you use a lot in easy-to-reach places.  If you need to reach something above you, use a strong step stool that has a grab bar.  Keep electrical cords out of the way.  Do not use floor polish or wax that makes floors slippery. If you must use wax, use non-skid floor wax.  Do not have throw rugs and other things on the floor that can make you trip. What can I do with my stairs?  Do not leave any items on the stairs.  Make sure that there are handrails on both sides of the stairs and use them. Fix handrails that are broken or loose. Make sure that handrails are as long as the stairways.  Check any carpeting to make sure that it is firmly attached to the stairs. Fix any carpet that is loose or worn.  Avoid having throw rugs at the top or bottom of the stairs. If you do have throw rugs, attach them to the floor with carpet tape.  Make sure that you have a light switch at the top of the stairs and the bottom of the stairs. If you do not have them, ask someone to  add them for you. What else can I do to help prevent falls?  Wear shoes that:  Do not have high heels.  Have rubber bottoms.  Are comfortable and fit you well.  Are closed at the toe. Do not wear sandals.  If you use a stepladder:  Make sure that it is fully opened. Do not climb a closed stepladder.  Make sure that both sides of the stepladder are locked into place.  Ask someone to hold it for you, if possible.  Clearly mark and make sure that you can see:  Any grab bars or handrails.  First and last steps.  Where the edge of each step is.  Use tools that help you move around (mobility aids) if they are needed. These include:  Canes.  Walkers.  Scooters.  Crutches.  Turn on the lights when you go into a dark area. Replace any light bulbs as soon as they burn out.  Set up your furniture so you have a clear path. Avoid moving your furniture around.  If any of your floors are uneven, fix them.  If there are any pets around you, be aware of where they are.  Review your medicines with your doctor. Some medicines can make you feel dizzy. This can increase your chance of falling. Ask your doctor what other things that you can do to help prevent falls. This information is not intended to replace advice given to you by your health care provider. Make sure you discuss any questions you have with your health care provider. Document Released: 03/20/2009 Document Revised: 10/30/2015 Document Reviewed: 06/28/2014 Elsevier Interactive Patient Education  2017 [employer].

## 2020-07-15 NOTE — Progress Notes (Addendum)
Subjective:   Sheila Osborne is a 85 y.o. female who presents for an Initial Medicare Annual Wellness Visit.  Review of Systems    No ROS.  Medicare Wellness Virtual Visit.         Objective:    Today's Vitals   07/15/20 1308  Weight: 139 lb (63 kg)  Height: 5\' 5"  (1.651 m)   Body mass index is 23.13 kg/m.  Advanced Directives 08/25/2016 06/21/2016 12/20/2015  Does Patient Have a Medical Advance Directive? No No No  Would patient like information on creating a medical advance directive? - Yes (MAU/Ambulatory/Procedural Areas - Information given) -    Current Medications (verified) Outpatient Encounter Medications as of 07/15/2020  Medication Sig   ALPRAZolam (XANAX) 0.25 MG tablet Take 0.25 mg by mouth 2 (two) times daily as needed.   amLODipine (NORVASC) 10 MG tablet Take by mouth.   aspirin 325 MG tablet Take 325 mg by mouth daily.   doxycycline (VIBRA-TABS) 100 MG tablet Take 1 tablet (100 mg total) by mouth 2 (two) times daily.   Ergocalciferol (VITAMIN D2 PO) Take 1.25 mg by mouth once a week.   furosemide (LASIX) 20 MG tablet Take by mouth.   metoprolol tartrate (LOPRESSOR) 12.5 mg TABS tablet Take 12.5 mg by mouth 2 (two) times daily.    vitamin C (ASCORBIC ACID) 500 MG tablet Take 500 mg by mouth daily.   No facility-administered encounter medications on file as of 07/15/2020.    Allergies (verified) Patient has no known allergies.   History: Past Medical History:  Diagnosis Date   Allergy    Arthritis    Carotid bruit    Heart murmur    Hyperlipidemia    Hypertension    Syncope 2012   Past Surgical History:  Procedure Laterality Date   AORTIC VALVE REPLACEMENT  04/26/2016   BREAST BIOPSY Left 1997   core- neg   CARDIAC CATHETERIZATION Bilateral 02/13/2016   Procedure: Right/Left Heart Cath and Coronary Angiography;  Surgeon: Teodoro Spray, MD;  Location: [facility];  Service: Cardiovascular;  Laterality: Bilateral;   CARDIAC VALVE  REPLACEMENT     CATARACT EXTRACTION  2014   x2   COLONOSCOPY  2006   COLONOSCOPY WITH PROPOFOL N/A 08/25/2016   Procedure: COLONOSCOPY WITH PROPOFOL;  Surgeon: Robert Bellow, MD;  Location: [facility];  Service: Endoscopy;  Laterality: N/A;   Family History  Problem Relation Age of Onset   Cancer Father        stomach   Early death Father    Hyperlipidemia Mother    Hypertension Mother    Stroke Mother    Hypertension Sister    Stroke Maternal Grandfather    Cancer Paternal Grandfather    Early death Paternal Grandfather    Hypertension Sister    Social History   Socioeconomic History   Marital status: Widowed    Spouse name: Not on file   Number of children: Not on file   Years of education: Not on file   Highest education level: Not on file  Occupational History   Not on file  Tobacco Use   Smoking status: Never Smoker   Smokeless tobacco: Never Used  Substance and Sexual Activity   Alcohol use: Yes    Alcohol/week: 0.0 standard drinks    Comment: 1/day   Drug use: No   Sexual activity: Not Currently  Other Topics Concern   Not on file  Social History Narrative   Not  on file   Social Determinants of Health   Financial Resource Strain: Low Risk    Difficulty of Paying Living Expenses: Not hard at all  Food Insecurity: No Food Insecurity   Worried About Charity fundraiser in the Last Year: Never true   Arboriculturist in the Last Year: Never true  Transportation Needs: No Transportation Needs   Lack of Transportation (Medical): No   Lack of Transportation (Non-Medical): No  Physical Activity: Not on file  Stress: No Stress Concern Present   Feeling of Stress : Not at all  Social Connections: Unknown   Frequency of Communication with Friends and Family: More than three times a week   Frequency of Social Gatherings with Friends and Family: More than three times a week   Attends Religious Services: Not on Electrical engineer or  Organizations: Not on file   Attends Archivist Meetings: Not on file   Marital Status: Not on file    Tobacco Counseling Counseling given: Not Answered   Clinical Intake:  Pre-visit preparation completed: Yes        Diabetes: No  How often do you need to have someone help you when you read instructions, pamphlets, or other written materials from your doctor or pharmacy?: 1 - Never   Interpreter Needed?: No      Activities of Daily Living No flowsheet data found.  Patient Care Team: Crecencio Mc, MD as PCP - General (Internal Medicine) Bary Castilla [name], MD (General Surgery) Lenard Simmer, MD as Attending Physician (Endocrinology)  Indicate any recent Medical Services you may have received from other than [facility] providers in the past year (date may be approximate).     Assessment:   This is a routine wellness examination for Dorsey.  I connected with Tejah today by telephone and verified that I am speaking with the correct person using two identifiers. Location patient: home Location provider: work Persons participating in the virtual visit: patient, Marine scientist.    I discussed the limitations, risks, security and privacy concerns of performing an evaluation and management service by telephone and the availability of in person appointments. The patient expressed understanding and verbally consented to this telephonic visit.    Interactive audio and video telecommunications were attempted between this provider and patient, however failed, due to patient having technical difficulties OR patient did not have access to video capability.  We continued and completed visit with audio only.  Some vital signs may be absent or patient reported.   Hearing/Vision screen No exam data present  Dietary issues and exercise activities discussed:    Healthy diet Good water intake  Goals       Patient Stated     Maintain healthy lifestyle (pt-stated)      Stay  active Healthy diet       Depression Screen PHQ 2/9 Scores 07/15/2020 08/28/2019 06/21/2016  PHQ - 2 Score 0 1 0  PHQ- 9 Score - - 0    Fall Risk Fall Risk  08/28/2019 08/01/2018 06/21/2016  Falls in the past year? 0 0 No  Follow up Falls evaluation completed - -    FALL RISK PREVENTION PERTAINING TO THE HOME: Handrails in use when climbing stairs? Yes Home free of loose throw rugs in walkways, pet beds, electrical cords, etc? Yes  Adequate lighting in your home to reduce risk of falls? Yes   ASSISTIVE DEVICES UTILIZED TO PREVENT FALLS: Use of a cane, walker  or w/c? No   TIMED UP AND GO: Was the test performed? No . Virtual visit.   Cognitive Function:  Patient is alert and oriented x3.  Denies difficulty focusing, making decisions, memory loss.  Enjoys painting, reading and socializing.  MMSE/6CIT deferred. Normal by direct communication/ observation.       Immunizations Immunization History  Administered Date(s) Administered   Influenza-Unspecified 03/27/2019, 03/20/2020   PFIZER(Purple Top)SARS-COV-2 Vaccination 06/21/2019, 07/12/2019, 03/17/2020   Health Maintenance Health Maintenance  Topic Date Due   TETANUS/TDAP  Never done   DEXA SCAN  Never done   PNA vac Low Risk Adult (1 of 2 - PCV13) Never done   COVID-19 Vaccine (4 - Booster for Pfizer series) 09/15/2020   INFLUENZA VACCINE  Completed   Colorectal cancer screening: No longer required.   Mammogram- ordered 06/12/20. Dr. Bary Castilla.    Bone density- deferred per patient for discussion with pcp.   Tdap- deferred.   PNA vaccine- deferred per patient preference for discussion with pcp.   Lung Cancer Screening: (Low Dose CT Chest recommended if Age 64-80 years, 30 pack-year currently smoking OR have quit w/in 15years.) does not qualify.   Hepatitis C Screening: does not qualify.  Vision Screening: Recommended annual ophthalmology exams for early detection of glaucoma and other disorders of the eye. Is the  patient up to date with their annual eye exam?  Yes  Who is the provider or what is the name of the office in which the patient attends annual eye exams? [facility], Dr. George Ina.  Dental Screening: Recommended annual dental exams for proper oral hygiene. Visits every 6 months.   Community Resource Referral / Chronic Care Management: CRR required this visit?  No   CCM required this visit?  No      Plan:   Keep all routine maintenance appointments.   Follow up 07/17/20 @ 12:00.  I have personally reviewed and noted the following in the patient's chart:   Medical and social history Use of alcohol, tobacco or illicit drugs  Current medications and supplements Functional ability and status Nutritional status Physical activity Advanced directives List of other physicians Hospitalizations, surgeries, and ER visits in previous 12 months Vitals Screenings to include cognitive, depression, and falls Referrals and appointments  In addition, I have reviewed and discussed with patient certain preventive protocols, quality metrics, and best practice recommendations. A written personalized care plan for preventive services as well as general preventive health recommendations were provided to patient via mychart.     OBrien-Blaney, Kelvon Giannini L, LPN   01/06/5002    I have reviewed the above information and agree with above.   Deborra Medina, MD

## 2020-07-17 ENCOUNTER — Other Ambulatory Visit: Payer: Self-pay

## 2020-07-17 ENCOUNTER — Encounter: Payer: Self-pay | Admitting: Internal Medicine

## 2020-07-17 ENCOUNTER — Ambulatory Visit (INDEPENDENT_AMBULATORY_CARE_PROVIDER_SITE_OTHER): Payer: Medicare Other | Admitting: Internal Medicine

## 2020-07-17 VITALS — BP 132/70 | HR 63 | Temp 97.7°F | Ht 65.0 in | Wt 139.6 lb

## 2020-07-17 DIAGNOSIS — Z7902 Long term (current) use of antithrombotics/antiplatelets: Secondary | ICD-10-CM

## 2020-07-17 DIAGNOSIS — E559 Vitamin D deficiency, unspecified: Secondary | ICD-10-CM | POA: Diagnosis not present

## 2020-07-17 DIAGNOSIS — Z23 Encounter for immunization: Secondary | ICD-10-CM | POA: Diagnosis not present

## 2020-07-17 DIAGNOSIS — I10 Essential (primary) hypertension: Secondary | ICD-10-CM | POA: Diagnosis not present

## 2020-07-17 DIAGNOSIS — I4891 Unspecified atrial fibrillation: Secondary | ICD-10-CM

## 2020-07-17 DIAGNOSIS — M8580 Other specified disorders of bone density and structure, unspecified site: Secondary | ICD-10-CM

## 2020-07-17 DIAGNOSIS — I359 Nonrheumatic aortic valve disorder, unspecified: Secondary | ICD-10-CM

## 2020-07-17 DIAGNOSIS — I9789 Other postprocedural complications and disorders of the circulatory system, not elsewhere classified: Secondary | ICD-10-CM | POA: Diagnosis not present

## 2020-07-17 DIAGNOSIS — Z780 Asymptomatic menopausal state: Secondary | ICD-10-CM

## 2020-07-17 MED ORDER — TETANUS-DIPHTH-ACELL PERTUSSIS 5-2.5-18.5 LF-MCG/0.5 IM SUSY: 0.5000 mL | PREFILLED_SYRINGE | Freq: Once | INTRAMUSCULAR | 0 refills | Status: AC

## 2020-07-17 MED ORDER — ZOSTER VAC RECOMB ADJUVANTED 50 MCG/0.5ML IM SUSR: 0.5000 mL | Freq: Once | INTRAMUSCULAR | 1 refills | Status: AC

## 2020-07-17 NOTE — Progress Notes (Signed)
Subjective:  Patient ID: Sheila Osborne, female    DOB: 03-24-1936  Age: 85 y.o. MRN: [id number]  CC: The primary encounter diagnosis was Hypertension, essential. Diagnoses of Vitamin D deficiency, Postoperative atrial fibrillation ([place]), Encounter for current long term use of antiplatelet drug, Need for vaccination with 13-polyvalent pneumococcal conjugate vaccine, Aortic valve disease, and Osteopenia after menopause were also pertinent to this visit.  HPI [place] presents for follow up on hypertension,[name]  This visit occurred during the SARS-CoV-2 public health emergency.  Safety protocols were in place, including screening questions prior to the visit, additional usage of staff PPE, and extensive cleaning of exam room while observing appropriate contact time as indicated for disinfecting solutions.   Hypertension: patient checks blood pressure twice weekly at home.  Readings have been for the most part < 140/80 at rest . Patient is following a reduce salt diet most days and is taking medications as prescribed. she has been Feeling better since changing medication  to metoprolol XL  Last DEXA 2 yrs ago with [facility] ,  Last reclast infusion was 2 years ago .  Has always been osteopenic.  Takes vitamin d  1000 Ius daily   GAD:  The patient feels generally well,  And  denies any feelings of hopelessness or anhedonia.  No panic attacks or insomnia .  Patient denies any active or passive suicidal thoughts or psychosis.  Appetite is good. Social support system appears adequate.  She has several siblings,  Face times with one sister in [place] for an hour each day at happy hour     Outpatient Medications Prior to Visit  Medication Sig Dispense Refill  . amLODipine (NORVASC) 10 MG tablet Take by mouth.    Marland Kitchen aspirin 325 MG tablet Take 325 mg by mouth daily.    Marland Kitchen doxycycline (VIBRA-TABS) 100 MG tablet Take 1 tablet (100 mg total)  by mouth 2 (two) times daily. 20 tablet 0  . Ergocalciferol (VITAMIN D2 PO) Take 1.25 mg by mouth once a week.    . furosemide (LASIX) 20 MG tablet Take by mouth.    . metoprolol succinate (TOPROL-XL) 25 MG 24 hr tablet Take 25 mg by mouth daily.    . vitamin C (ASCORBIC ACID) 500 MG tablet Take 500 mg by mouth daily.    Marland Kitchen ALPRAZolam (XANAX) 0.25 MG tablet Take 0.25 mg by mouth 2 (two) times daily as needed.    . metoprolol tartrate (LOPRESSOR) 12.5 mg TABS tablet Take 12.5 mg by mouth 2 (two) times daily.      No facility-administered medications prior to visit.    Review of Systems;  Patient denies headache, fevers, malaise, unintentional weight loss, skin rash, eye pain, sinus congestion and sinus pain, sore throat, dysphagia,  hemoptysis , cough, dyspnea, wheezing, chest pain, palpitations, orthopnea, edema, abdominal pain, nausea, melena, diarrhea, constipation, flank pain, dysuria, hematuria, urinary  Frequency, nocturia, numbness, tingling, seizures,  Focal weakness, Loss of consciousness,  Tremor, insomnia, depression, anxiety, and suicidal ideation.      Objective:  BP 132/70 (BP Location: Left Arm, Patient Position: Sitting)   Pulse 63   Temp 97.7 F (36.5 C)   Ht 5\' 5"  (1.651 m)   Wt 139 lb 9.6 oz (63.3 kg)   SpO2 98%   BMI 23.23 kg/m   BP Readings from Last 3 Encounters:  07/17/20 132/70  08/28/19 134/80  08/01/18 (!) 147/85    Wt Readings from Last 3 Encounters:  07/17/20 139 lb 9.6 oz (63.3 kg)  07/15/20 139 lb (63 kg)  08/28/19 139 lb 6.4 oz (63.2 kg)    General appearance: alert, cooperative and appears stated age Ears: normal TM's and external ear canals both ears Throat: lips, mucosa, and tongue normal; teeth and gums normal Neck: no adenopathy, no carotid bruit, supple, symmetrical, trachea midline and thyroid not enlarged, symmetric, no tenderness/mass/nodules Back: symmetric, no curvature. ROM normal. No CVA tenderness. Lungs: clear to auscultation  bilaterally Heart: regular rate and rhythm, S1, S2 normal, no murmur, click, rub or gallop Abdomen: soft, non-tender; bowel sounds normal; no masses,  no organomegaly Pulses: 2+ and symmetric Skin: Skin color, texture, turgor normal. No rashes or lesions Lymph nodes: Cervical, supraclavicular, and axillary nodes normal.  No results found for: HGBA1C  Lab Results  Component Value Date   CREATININE 0.78 12/25/2019   CREATININE 0.58 (L) 08/25/2011    Lab Results  Component Value Date   WBC 4.7 08/25/2011   HGB 11.9 (L) 08/25/2011   HCT 34.8 (L) 08/25/2011   PLT 232 08/25/2011   GLUCOSE 103 (H) 12/25/2019   CHOL 236 (H) 12/25/2019   TRIG 70.0 12/25/2019   HDL 104.30 12/25/2019   LDLCALC 117 (H) 12/25/2019   ALT 11 12/25/2019   AST 17 12/25/2019   NA 141 12/25/2019   K 3.7 12/25/2019   CL 102 12/25/2019   CREATININE 0.78 12/25/2019   BUN 13 12/25/2019   CO2 33 (H) 12/25/2019    MM 3D SCREEN BREAST BILATERAL  Result Date: 07/23/2019 CLINICAL DATA:  Screening. EXAM: DIGITAL SCREENING BILATERAL MAMMOGRAM WITH TOMO AND CAD COMPARISON:  Previous exam(s). ACR Breast Density Category c: The breast tissue is heterogeneously dense, which may obscure small masses. FINDINGS: There are no findings suspicious for malignancy. Images were processed with CAD. IMPRESSION: No mammographic evidence of malignancy. A result letter of this screening mammogram will be mailed directly to the patient. RECOMMENDATION: Screening mammogram in one year. (Code:SM-B-01Y) BI-RADS CATEGORY  1: Negative. Electronically Signed   By: Claudie Revering M.D.   On: 07/23/2019 16:30    Assessment & Plan:   Problem List Items Addressed This Visit      Unprioritized   Aortic valve disease    Congenitally bicuspid valve,  S/p AV in 2017       Relevant Medications   metoprolol succinate (TOPROL-XL) 25 MG 24 hr tablet   Hypertension, essential - Primary    Well controlled on current regimen of amlodipine and  metoprolol. Renal function stable, no changes today.      Relevant Medications   metoprolol succinate (TOPROL-XL) 25 MG 24 hr tablet   Other Relevant Orders   Comprehensive metabolic panel   Osteopenia after menopause    Treated by her former PCP with annual infusions of Reclast.  Last was was in 2020, records not available currently.;   She has adequate Vit d levels .  Discussed calcium requirements and sources.         Postoperative atrial fibrillation ([facility])   Relevant Medications   metoprolol succinate (TOPROL-XL) 25 MG 24 hr tablet   Other Relevant Orders   TSH   Vitamin D deficiency   Relevant Orders   VITAMIN D 25 Hydroxy (Vit-D Deficiency, Fractures)    Other Visit Diagnoses    Encounter for current long term use of antiplatelet drug       Relevant Orders   CBC with Differential/Platelet   Need for vaccination with 13-polyvalent pneumococcal conjugate vaccine  Relevant Orders   Pneumococcal conjugate vaccine 13-valent (Completed)      I have discontinued Jerilyn B. Weis's metoprolol tartrate and ALPRAZolam. I am also having her start on Zoster Vaccine Adjuvanted and Tdap. Additionally, I am having her maintain her Ergocalciferol (VITAMIN D2 PO), aspirin, vitamin C, furosemide, amLODipine, doxycycline, and metoprolol succinate.  Meds ordered this encounter  Medications  . Zoster Vaccine Adjuvanted [facility]) injection    Sig: Inject 0.5 mLs into the muscle once for 1 dose.    Dispense:  1 each    Refill:  1  . Tdap (BOOSTRIX) 5-2.5-18.5 LF-MCG/0.5 injection    Sig: Inject 0.5 mLs into the muscle once for 1 dose.    Dispense:  0.5 mL    Refill:  0    Medications Discontinued During This Encounter  Medication Reason  . ALPRAZolam (XANAX) 0.25 MG tablet Completed Course  . metoprolol tartrate (LOPRESSOR) 12.5 mg TABS tablet Error    Follow-up: No follow-ups on file.   Crecencio Mc, MD

## 2020-07-17 NOTE — Patient Instructions (Addendum)
I recommend drinking a glass of soy milk daily for the calcium in it. (You need 1200 mg daily)  You received the Prevnar pneumonia vaccine today  I recommend getting the TdaP and Shingles vaccines at your leisure  Your may be due for your tetanus-diptheria-pertussis vaccine (TDaP) but you can get it for less $$$ at a local pharmacy with the script I have provided you.    The ShingRx vaccine is now available in local pharmacies and is much more protective thant Zostavaxs,  It is therefore ADVISED for all interested adults over 50 to prevent shingles    I will let you know if the DEXA scan is needed once I see Dr Barkley Boards records    Return in July for [place]

## 2020-07-19 DIAGNOSIS — Z780 Asymptomatic menopausal state: Secondary | ICD-10-CM | POA: Insufficient documentation

## 2020-07-19 NOTE — Assessment & Plan Note (Addendum)
Treated by her former PCP with annual infusions of Reclast.  Last was was in 2020, records not available currently.;   She has adequate Vit d levels .  Discussed calcium requirements and sources.

## 2020-07-19 NOTE — Assessment & Plan Note (Signed)
Well controlled on current regimen of amlodipine and metoprolol. Renal function stable, no changes today.

## 2020-07-19 NOTE — Assessment & Plan Note (Signed)
Congenitally bicuspid valve,  S/p AV in 2017

## 2020-07-23 ENCOUNTER — Other Ambulatory Visit: Payer: Self-pay

## 2020-07-23 ENCOUNTER — Ambulatory Visit
Admission: RE | Admit: 2020-07-23 | Discharge: 2020-07-23 | Disposition: A | Discharge status: Home / Self Care | Payer: Medicare Other | Source: Ambulatory Visit | Attending: General Surgery | Admitting: General Surgery

## 2020-07-23 DIAGNOSIS — Z1231 Encounter for screening mammogram for malignant neoplasm of breast: Secondary | ICD-10-CM | POA: Insufficient documentation

## 2020-12-11 ENCOUNTER — Telehealth: Payer: Self-pay | Admitting: Internal Medicine

## 2020-12-11 NOTE — Telephone Encounter (Signed)
PT called to state she is still having a noise in ear sound and would like to see if Dr.Tullo would like to see her or if she needs to be seen by a Ear Doctor.

## 2020-12-11 NOTE — Telephone Encounter (Signed)
Called pt to schedule her for an appt. Pt stated that she fell and hit her head about two weeks ago. Since she fell she has had a change in her hearing. She stated that loud noises seem even louder and she always has a noise that sounds like a motor or machine running in her ears. Pt has been scheduled for the first available appt next week with Dr. Caryl Bis on Wednesday.

## 2020-12-16 ENCOUNTER — Other Ambulatory Visit: Payer: Self-pay

## 2020-12-16 ENCOUNTER — Other Ambulatory Visit: Payer: Medicare Other

## 2020-12-16 ENCOUNTER — Other Ambulatory Visit (INDEPENDENT_AMBULATORY_CARE_PROVIDER_SITE_OTHER): Payer: Medicare Other

## 2020-12-16 DIAGNOSIS — E559 Vitamin D deficiency, unspecified: Secondary | ICD-10-CM

## 2020-12-16 DIAGNOSIS — I4891 Unspecified atrial fibrillation: Secondary | ICD-10-CM

## 2020-12-16 DIAGNOSIS — I9789 Other postprocedural complications and disorders of the circulatory system, not elsewhere classified: Secondary | ICD-10-CM

## 2020-12-16 DIAGNOSIS — I10 Essential (primary) hypertension: Secondary | ICD-10-CM | POA: Diagnosis not present

## 2020-12-16 DIAGNOSIS — Z7902 Long term (current) use of antithrombotics/antiplatelets: Secondary | ICD-10-CM | POA: Diagnosis not present

## 2020-12-16 LAB — CBC WITH DIFFERENTIAL/PLATELET
Basophils Absolute: 0 10*3/uL (ref 0.0–0.1)
Basophils Relative: 0.9 % (ref 0.0–3.0)
Eosinophils Absolute: 0.1 10*3/uL (ref 0.0–0.7)
Eosinophils Relative: 1.9 % (ref 0.0–5.0)
HCT: 37.8 % (ref 36.0–46.0)
Hemoglobin: 12.8 g/dL (ref 12.0–15.0)
Lymphocytes Relative: 35.3 % (ref 12.0–46.0)
Lymphs Abs: 1.5 10*3/uL (ref 0.7–4.0)
MCHC: 33.7 g/dL (ref 30.0–36.0)
MCV: 94 fl (ref 78.0–100.0)
Monocytes Absolute: 0.5 10*3/uL (ref 0.1–1.0)
Monocytes Relative: 10.9 % (ref 3.0–12.0)
Neutro Abs: 2.2 10*3/uL (ref 1.4–7.7)
Neutrophils Relative %: 51 % (ref 43.0–77.0)
Platelets: 251 10*3/uL (ref 150.0–400.0)
RBC: 4.02 Mil/uL (ref 3.87–5.11)
RDW: 13 % (ref 11.5–15.5)
WBC: 4.3 10*3/uL (ref 4.0–10.5)

## 2020-12-16 LAB — COMPREHENSIVE METABOLIC PANEL
ALT: 12 U/L (ref 0–35)
AST: 17 U/L (ref 0–37)
Albumin: 4.4 g/dL (ref 3.5–5.2)
Alkaline Phosphatase: 45 U/L (ref 39–117)
BUN: 11 mg/dL (ref 6–23)
CO2: 29 mEq/L (ref 19–32)
Calcium: 9.4 mg/dL (ref 8.4–10.5)
Chloride: 102 mEq/L (ref 96–112)
Creatinine, Ser: 0.76 mg/dL (ref 0.40–1.20)
GFR: 72.87 mL/min (ref >60.00)
Glucose, Bld: 93 mg/dL (ref 70–99)
Potassium: 4 mEq/L (ref 3.5–5.1)
Sodium: 139 mEq/L (ref 135–145)
Total Bilirubin: 0.6 mg/dL (ref 0.2–1.2)
Total Protein: 6.9 g/dL (ref 6.0–8.3)

## 2020-12-16 LAB — VITAMIN D 25 HYDROXY (VIT D DEFICIENCY, FRACTURES): VITD: 61.67 ng/mL (ref 30.00–100.00)

## 2020-12-16 LAB — TSH: TSH: 1.13 u[IU]/mL (ref 0.35–5.50)

## 2020-12-17 ENCOUNTER — Ambulatory Visit
Admission: RE | Admit: 2020-12-17 | Discharge: 2020-12-17 | Disposition: A | Discharge status: Home / Self Care | Payer: Medicare Other | Source: Ambulatory Visit | Attending: Family Medicine | Admitting: Family Medicine

## 2020-12-17 ENCOUNTER — Telehealth: Payer: Self-pay | Admitting: Internal Medicine

## 2020-12-17 ENCOUNTER — Ambulatory Visit (INDEPENDENT_AMBULATORY_CARE_PROVIDER_SITE_OTHER): Payer: Medicare Other | Admitting: Family Medicine

## 2020-12-17 ENCOUNTER — Other Ambulatory Visit: Payer: Self-pay | Admitting: Internal Medicine

## 2020-12-17 DIAGNOSIS — H9313 Tinnitus, bilateral: Secondary | ICD-10-CM

## 2020-12-17 DIAGNOSIS — S0990XA Unspecified injury of head, initial encounter: Secondary | ICD-10-CM | POA: Insufficient documentation

## 2020-12-17 DIAGNOSIS — R930 Abnormal findings on diagnostic imaging of skull and head, not elsewhere classified: Secondary | ICD-10-CM

## 2020-12-17 DIAGNOSIS — S0990XD Unspecified injury of head, subsequent encounter: Secondary | ICD-10-CM

## 2020-12-17 HISTORY — DX: Unspecified injury of head, initial encounter: S09.90XA

## 2020-12-17 NOTE — Assessment & Plan Note (Signed)
Patient had a head injury about 6 weeks ago and has had intermittent tinnitus bilaterally since then.  She has a benign neurological exam.  Discussed the potential for an intracranial hemorrhage though with her neurological exam and duration since the event that seems to be somewhat low likelihood though is worth evaluating given her age, persistent issues with tinnitus, and being on a higher dose of aspirin daily.  Discussed if her CT scan was negative we would send her for an audiology evaluation for her tinnitus.

## 2020-12-17 NOTE — Patient Instructions (Signed)
Nice to see you. If you develop significant headaches or worsening headaches, dizziness, numbness, weakness, or any other new symptoms please be reevaluated. We will contact you with your CT scan results.

## 2020-12-17 NOTE — Progress Notes (Signed)
Tommi Rumps, MD Phone: [phone]  Sheila Osborne is a 85 y.o. female who presents today for same day visit.  Head injury/tinnitus: Patient notes she fell after tripping on the hem of her dress about 6 weeks ago.  She hit the right side of her head and notes since then she has had tinnitus that sounds like a fan or a motor.  It is intermittent.  It is not pulsatile.  She does note some hearing loss on 1 occasion due to the loudness of the tinnitus.  She notes no ear fullness.  No vertigo.  Notes the tinnitus is bilateral.  She has a history of chronic headaches and notes no change to those.  She notes no loss of consciousness with the head injury.  She does take aspirin 325 mg once daily.    Social History   Tobacco Use  Smoking Status Never  Smokeless Tobacco Never    Current Outpatient Medications on File Prior to Visit  Medication Sig Dispense Refill   amLODipine (NORVASC) 10 MG tablet Take by mouth.     aspirin 325 MG tablet Take 325 mg by mouth daily.     doxycycline (VIBRA-TABS) 100 MG tablet Take 1 tablet (100 mg total) by mouth 2 (two) times daily. 20 tablet 0   Ergocalciferol (VITAMIN D2 PO) Take 1.25 mg by mouth once a week.     furosemide (LASIX) 20 MG tablet Take by mouth.     metoprolol succinate (TOPROL-XL) 25 MG 24 hr tablet Take 25 mg by mouth daily.     vitamin C (ASCORBIC ACID) 500 MG tablet Take 500 mg by mouth daily.     No current facility-administered medications on file prior to visit.     ROS see history of present illness  Objective  Physical Exam Vitals:   12/17/20 0952  BP: 120/80  Pulse: 60  Temp: 98.1 F (36.7 C)  SpO2: 99%    BP Readings from Last 3 Encounters:  12/17/20 120/80  07/17/20 132/70  08/28/19 134/80   Wt Readings from Last 3 Encounters:  12/17/20 139 lb 9.6 oz (63.3 kg)  07/17/20 139 lb 9.6 oz (63.3 kg)  07/15/20 139 lb (63 kg)    Physical Exam Constitutional:      General: She is not in acute  distress.    Appearance: She is not diaphoretic.  HENT:     Head: No raccoon eyes or Battle's sign.     Right Ear: Tympanic membrane normal.     Left Ear: Tympanic membrane normal.  Cardiovascular:     Rate and Rhythm: Normal rate and regular rhythm.     Heart sounds: No murmur heard. Pulmonary:     Effort: Pulmonary effort is normal.     Breath sounds: Normal breath sounds.  Skin:    General: Skin is warm and dry.  Neurological:     Mental Status: She is alert.     Comments: EOMI, PERRL, opens and closes eyes adequately, V1 through V3 intact to light touch bilaterally, hearing intact to finger rub, shoulder shrug intact, 5/5 strength in bilateral biceps, triceps, grip, quads, hamstrings, plantar and dorsiflexion, sensation to light touch intact in bilateral UE and LE, normal gait     Assessment/Plan: Please see individual problem list.  Problem List Items Addressed This Visit     Head injury    Patient had a head injury about 6 weeks ago and has had intermittent tinnitus bilaterally since then.  She has a benign  neurological exam.  Discussed the potential for an intracranial hemorrhage though with her neurological exam and duration since the event that seems to be somewhat low likelihood though is worth evaluating given her age, persistent issues with tinnitus, and being on a higher dose of aspirin daily.  Discussed if her CT scan was negative we would send her for an audiology evaluation for her tinnitus.       Relevant Orders   CT Head Wo Contrast    Return if symptoms worsen or fail to improve.  This visit occurred during the SARS-CoV-2 public health emergency.  Safety protocols were in place, including screening questions prior to the visit, additional usage of staff PPE, and extensive cleaning of exam room while observing appropriate contact time as indicated for disinfecting solutions.    Tommi Rumps, MD [place]

## 2020-12-17 NOTE — Assessment & Plan Note (Signed)
The CT that Dr Caryl Bis ordered was reassuring that there were no skull fractures or hemorrhages,  but there were some changes on the left side of the brain that could represent an infarct that occurred within the past 6 months.   An old infarct on the right side was seen.   I am recommending an MRI for further evaluation and will order this now,  and depending on the results,  I will have her follow up either with neurology or with ENT

## 2020-12-17 NOTE — Telephone Encounter (Signed)
The CT that Dr Caryl Bis ordered was reassuring that there were no skull fractures or hemorrhages,  but there were some changes on the left side of the brain that could represent an infarct that occurred within the past 6 months.   An old infarct on the right side was seen.   I am recommending an MRI for further evaluation and will order this now,  and depending on the results,  I will have her follow up either with neurology or with ENT

## 2020-12-19 ENCOUNTER — Ambulatory Visit
Admission: RE | Admit: 2020-12-19 | Discharge: 2020-12-19 | Disposition: A | Discharge status: Home / Self Care | Payer: Medicare Other | Source: Ambulatory Visit | Attending: Internal Medicine | Admitting: Internal Medicine

## 2020-12-19 ENCOUNTER — Other Ambulatory Visit: Payer: Self-pay

## 2020-12-19 DIAGNOSIS — H9313 Tinnitus, bilateral: Secondary | ICD-10-CM | POA: Diagnosis present

## 2020-12-19 DIAGNOSIS — R930 Abnormal findings on diagnostic imaging of skull and head, not elsewhere classified: Secondary | ICD-10-CM | POA: Diagnosis present

## 2020-12-19 DIAGNOSIS — S0990XD Unspecified injury of head, subsequent encounter: Secondary | ICD-10-CM | POA: Diagnosis present

## 2020-12-19 MED ORDER — GADOBUTROL 1 MMOL/ML IV SOLN
6.0000 mL | Freq: Once | INTRAVENOUS | Status: AC | PRN
  Administered 2020-12-19: 6 mL via INTRAVENOUS

## 2020-12-24 ENCOUNTER — Encounter: Payer: Self-pay | Admitting: Internal Medicine

## 2020-12-24 ENCOUNTER — Ambulatory Visit (INDEPENDENT_AMBULATORY_CARE_PROVIDER_SITE_OTHER): Payer: Medicare Other | Admitting: Internal Medicine

## 2020-12-24 ENCOUNTER — Other Ambulatory Visit: Payer: Self-pay

## 2020-12-24 VITALS — BP 136/84 | HR 59 | Temp 96.8°F | Resp 15 | Ht 65.0 in | Wt 139.6 lb

## 2020-12-24 DIAGNOSIS — M791 Myalgia, unspecified site: Secondary | ICD-10-CM | POA: Diagnosis not present

## 2020-12-24 DIAGNOSIS — H9313 Tinnitus, bilateral: Secondary | ICD-10-CM

## 2020-12-24 DIAGNOSIS — T466X5A Adverse effect of antihyperlipidemic and antiarteriosclerotic drugs, initial encounter: Secondary | ICD-10-CM | POA: Diagnosis not present

## 2020-12-24 DIAGNOSIS — Z8673 Personal history of transient ischemic attack (TIA), and cerebral infarction without residual deficits: Secondary | ICD-10-CM

## 2020-12-24 DIAGNOSIS — L578 Other skin changes due to chronic exposure to nonionizing radiation: Secondary | ICD-10-CM

## 2020-12-24 NOTE — Patient Instructions (Signed)
Referral to [facility] on [address] is in process  I will make referral to [facility] ENT for tinnitus  Lacunar infarct was not seen on 2016 MRI done at [place] blood pressure < 130/80

## 2020-12-24 NOTE — Progress Notes (Signed)
Subjective:  Patient ID: Sheila Osborne, female    DOB: [birth year]  Age: 85 y.o. MRN: [id number]  CC: The primary encounter diagnosis was Sun-damaged skin. Diagnoses of Myalgia due to statin, Tinnitus of both ears, History of CVA (cerebrovascular accident) without residual deficits, and Tinnitus aurium, bilateral were also pertinent to this visit.  HPI [name] presents for follow up on MRI brain  done July 15   This visit occurred during the SARS-CoV-2 public health emergency.  Safety protocols were in place, including screening questions prior to the visit, additional usage of staff PPE, and extensive cleaning of exam room while observing appropriate contact time as indicated for disinfecting solutions.   MRi brain with IAC's'  was done to investigate   c/o tinnitus that began after a fall with blunt head trauma.    No acute changes were seen .  However, Right lacunar infarct with hemosiderin and chronic microhemorrhage in the right parietal lobe   Prior MRI /MRA done Nov 2016 at [facility]  after a syncopal episode.  Report was  reviewed today:  no mention of lacunar infarct   or bleed   She has a history of statin intolerance on 2 prior trials, lipid panel is ECXCELLENT and BP has been well cotrolled for the first time in 20 years. the past was not.  .    Outpatient Medications Prior to Visit  Medication Sig Dispense Refill   amLODipine (NORVASC) 10 MG tablet Take by mouth.     aspirin 325 MG tablet Take 325 mg by mouth daily.     furosemide (LASIX) 20 MG tablet Take 20 mg by mouth as needed.     metoprolol succinate (TOPROL-XL) 25 MG 24 hr tablet Take 25 mg by mouth daily.     vitamin C (ASCORBIC ACID) 500 MG tablet Take 500 mg by mouth daily.     doxycycline (VIBRA-TABS) 100 MG tablet Take 1 tablet (100 mg total) by mouth 2 (two) times daily. (Patient not taking: Reported on 12/24/2020) 20 tablet 0   Ergocalciferol (VITAMIN D2 PO) Take 1.25 mg by mouth once a  week. (Patient not taking: Reported on 12/24/2020)     No facility-administered medications prior to visit.    Review of Systems;  Patient denies headache, fevers, malaise, unintentional weight loss, skin rash, eye pain, sinus congestion and sinus pain, sore throat, dysphagia,  hemoptysis , cough, dyspnea, wheezing, chest pain, palpitations, orthopnea, edema, abdominal pain, nausea, melena, diarrhea, constipation, flank pain, dysuria, hematuria, urinary  Frequency, nocturia, numbness, tingling, seizures,  Focal weakness, Loss of consciousness,  Tremor, insomnia, depression, anxiety, and suicidal ideation.      Objective:  BP 136/84 (BP Location: Left Arm, Patient Position: Sitting, Cuff Size: Normal)   Pulse (!) 59   Temp (!) 96.8 F (36 C) (Temporal)   Resp 15   Ht 5\' 5"  (1.651 m)   Wt 139 lb 9.6 oz (63.3 kg)   SpO2 99%   BMI 23.23 kg/m   BP Readings from Last 3 Encounters:  12/24/20 136/84  12/17/20 120/80  07/17/20 132/70    Wt Readings from Last 3 Encounters:  12/24/20 139 lb 9.6 oz (63.3 kg)  12/17/20 139 lb 9.6 oz (63.3 kg)  07/17/20 139 lb 9.6 oz (63.3 kg)    General appearance: alert, cooperative and appears stated age Ears: normal TM's and external ear canals both ears Throat: lips, mucosa, and tongue normal; teeth and gums normal Neck: no adenopathy, no carotid bruit, supple,  symmetrical, trachea midline and thyroid not enlarged, symmetric, no tenderness/mass/nodules Back: symmetric, no curvature. ROM normal. No CVA tenderness. Lungs: clear to auscultation bilaterally Heart: regular rate and rhythm, S1, S2 normal, no murmur, click, rub or gallop Abdomen: soft, non-tender; bowel sounds normal; no masses,  no organomegaly Pulses: 2+ and symmetric Skin: Skin color, texture, turgor normal. No rashes or lesions Lymph nodes: Cervical, supraclavicular, and axillary nodes normal.  No results found for: HGBA1C  Lab Results  Component Value Date   CREATININE 0.76  12/16/2020   CREATININE 0.78 12/25/2019   CREATININE 0.58 (L) 08/25/2011    Lab Results  Component Value Date   WBC 4.3 12/16/2020   HGB 12.8 12/16/2020   HCT 37.8 12/16/2020   PLT 251.0 12/16/2020   GLUCOSE 93 12/16/2020   CHOL 236 (H) 12/25/2019   TRIG 70.0 12/25/2019   HDL 104.30 12/25/2019   LDLCALC 117 (H) 12/25/2019   ALT 12 12/16/2020   AST 17 12/16/2020   NA 139 12/16/2020   K 4.0 12/16/2020   CL 102 12/16/2020   CREATININE 0.76 12/16/2020   BUN 11 12/16/2020   CO2 29 12/16/2020   TSH 1.13 12/16/2020    MR BRAIN/IAC W WO CONTRAST  Result Date: 12/20/2020 CLINICAL DATA:  Tinnitus non pulsatile. History of head trauma. Abnormal CT. EXAM: MRI HEAD WITHOUT AND WITH CONTRAST TECHNIQUE: Multiplanar, multiecho pulse sequences of the brain and surrounding structures were obtained without and with intravenous contrast. CONTRAST:  3mL GADAVIST GADOBUTROL 1 MMOL/ML IV SOLN COMPARISON:  CT head 12/17/2020 FINDINGS: Brain: IAC protocol. Seventh and eighth cranial nerves normal. Negative for vestibular schwannoma. Normal enhancement of the temporal bone. Mastoid sinus clear bilaterally. Brainstem and cerebellum normal. Basilar cisterns normal. Ventricle size normal. Moderate white matter changes diffusely. Mild hyperintensity in the left pons. Prominent perivascular spaces in the basal ganglia and thalamus bilaterally. Chronic lacunar infarction right caudate with small amount of peripheral hemosiderin. Chronic microhemorrhage in the right parietal lobe. Negative for mass lesion. Vascular: Normal arterial flow voids. Skull and upper cervical spine: No focal skeletal lesion. Sinuses/Orbits: Mild mucosal edema paranasal sinuses. Bilateral cataract extraction Other: None IMPRESSION: No acute abnormality.  No cause for tinnitus Moderate chronic ischemic change.  No acute infarct or mass. Electronically Signed   By: Franchot Gallo M.D.   On: 12/20/2020 13:22    Assessment & Plan:   Problem  List Items Addressed This Visit       Unprioritized   Myalgia due to statin   History of CVA (cerebrovascular accident) without residual deficits    Recent MRI notes a right sided lacunar infarct which was not noted on 2016 MRI.  Reviewed  Blood pressure readings,  Lipid panel.        Tinnitus aurium, bilateral    Etiology likely sensorineural hearing loss , but began after a fall with blunt head injury.  MRI brain and MRA with IACs were nondiagnostic.  referral to ENT        Other Visit Diagnoses     Sun-damaged skin    -  Primary   Relevant Orders   Ambulatory referral to Dermatology   Tinnitus of both ears       Relevant Orders   Ambulatory referral to ENT       I provided  30 minutes of  face-to-face time during this encounter reviewing patient's current symptoms,  blood pressure readings,  problems and past surgeries, labs and imaging studies, providing counseling on the above mentioned problems ,  and coordination  of care .   I have discontinued Chevette B. Siemen's Ergocalciferol (VITAMIN D2 PO) and doxycycline. I am also having her maintain her aspirin, vitamin C, furosemide, amLODipine, and metoprolol succinate.  No orders of the defined types were placed in this encounter.   Medications Discontinued During This Encounter  Medication Reason   doxycycline (VIBRA-TABS) 100 MG tablet Completed Course   Ergocalciferol (VITAMIN D2 PO) Completed Course    Follow-up: No follow-ups on file.   Crecencio Mc, MD

## 2020-12-27 DIAGNOSIS — Z8673 Personal history of transient ischemic attack (TIA), and cerebral infarction without residual deficits: Secondary | ICD-10-CM | POA: Insufficient documentation

## 2020-12-27 DIAGNOSIS — H9313 Tinnitus, bilateral: Secondary | ICD-10-CM | POA: Insufficient documentation

## 2020-12-27 NOTE — Assessment & Plan Note (Signed)
Etiology likely sensorineural hearing loss , but began after a fall with blunt head injury.  MRI brain and MRA with IACs were nondiagnostic.  referral to ENT

## 2020-12-27 NOTE — Assessment & Plan Note (Signed)
Recent MRI notes a right sided lacunar infarct which was not noted on 2016 MRI.  Reviewed  Blood pressure readings,  Lipid panel.

## 2021-06-11 ENCOUNTER — Other Ambulatory Visit: Payer: Self-pay | Admitting: General Surgery

## 2021-06-11 NOTE — Progress Notes (Signed)
Subjective:     Patient ID: Sheila Osborne is a 86 y.o. female.   HPI   The following portions of the patient's history were reviewed and updated as appropriate.   This an established patient is here today for: office visit. Patient reports that she has experienced bright red blood per rectum. Symptoms started on Saturday night. Patient reports she had a "violent episode" of diarrhea. She reports she saw about 2 tablespoons of blood in the toilet bowl which occurred that day three different times. Patient states she did not have any more bowel movements since Saturday until Tuesday morning. She reports that the bowel movement was very small and she did have bleeding. Patient reports she has been eating a bland diet. Patient states she did have another bowel movement this morning but it was less than 1 tablespoon and she did have bleeding. She reports a lower abdominal dull pain which has not kept her from doing her usual activities. Patient denies fevers.  Patient reports she has stopped her 325 mg aspirin.      The patient had amoxicillin on November 30 prior to dental procedure which she has tolerated without ill effects in the past.   Prior to the onset of her diarrhea episode on 1224 she had had her usual daily stools that she helps with the use of psyllium and prunes.      Chief Complaint  Patient presents with   Rectal Bleeding      BP 130/82    Pulse 65    Temp 36.8 C (98.2 F)    Ht 165.1 cm (5\' 5" )    Wt 62.6 kg (138 lb)    SpO2 97%    BMI 22.96 kg/m        Past Medical History:  Diagnosis Date   Anxiety     Aortic stenosis due to bicuspid aortic valve 04/22/2016   Arthritis     Carotid bruit     Chest pain on exertion 01/12/2016   Heart murmur, unspecified      moderate as; ava 1.21 cm 2 11/16   History of shingles     Hyperlipidemia     Hypertension     Nonrheumatic aortic valve stenosis 07/11/2015   SOB (shortness of breath) 01/12/2016   Syncope and collapse 2012            Past Surgical History:  Procedure Laterality Date   BREAST EXCISIONAL BIOPSY Left 1997   COLONOSCOPY   2006   CATARACT EXTRACTION   2014   cardiac catheterization Bilateral 02/13/2016   REPLACEMENT AORTIC VALVE   04/26/2016   COLONOSCOPY   08/25/2016                OB History     Gravida  1   Para  1   Term      Preterm      AB      Living         SAB      IAB      Ectopic      Molar      Multiple      Live Births           Obstetric Comments  Age at first period 32 Age of first pregnancy 15 Age menopause 89           Social History           Socioeconomic History   Marital status: Widowed  Tobacco Use   Smoking status: Never   Smokeless tobacco: Never  Vaping Use   Vaping Use: Never used  Substance and Sexual Activity   Alcohol use: Yes      Comment: glass of wine every night   Drug use: No   Sexual activity: Defer        No Known Allergies   Current Medications        Current Outpatient Medications  Medication Sig Dispense Refill   ALPRAZolam (XANAX) 0.5 MG tablet Take 0.25 mg by mouth once daily as needed for Anxiety Reports taking 1/2 tablet as needed         amLODIPine (NORVASC) 5 MG tablet TAKE 1 TABLET BY MOUTH  TWICE DAILY 180 tablet 3   amoxicillin (AMOXIL) 500 MG tablet Take  4 tabs one hour prior to dental procedures 8 tablet 0   ascorbic acid, vitamin C, (VITAMIN C) 500 MG tablet Take 500 mg by mouth once daily.          ergocalciferol, vitamin D2, 50,000 unit capsule Take 1 capsule by mouth once a week.   1   FUROsemide (LASIX) 20 MG tablet Take 1 tablet (20 mg total) by mouth once daily as needed for Edema 90 tablet 1   metoprolol succinate (TOPROL-XL) 25 MG XL tablet TAKE 1 TABLET BY MOUTH ONCE DAILY 90 tablet 3   nitroGLYcerin (NITROSTAT) 0.4 MG SL tablet Place 1 tablet (0.4 mg total) under the tongue every 5 (five) minutes as needed for Chest pain May take up to 3 doses. 25 tablet 11   zoledronic acid  (RECLAST) 5 mg/100 mL injection         aspirin 325 MG tablet Take 325 mg by mouth once daily   (Patient not taking: Reported on 06/03/2021)        No current facility-administered medications for this visit.             Family History  Problem Relation Age of Onset   High blood pressure (Hypertension) Mother     Hyperlipidemia (Elevated cholesterol) Mother     Stroke Mother 107   Stomach cancer Father 81   Early death Father     High blood pressure (Hypertension) Sister     High blood pressure (Hypertension) Sister     Stroke Maternal Grandfather     Early death Paternal Grandfather     Cancer Paternal Grandfather     Breast cancer Neg Hx     Colon cancer Neg Hx          Labs and Radiology:    August 26, 2026 colonoscopy:   Significant looping, less than ideal prep.  8 mm tubular adenoma identified in the cecum.   December 16, 2020 laboratory:   WBC 4.0 - 10.5 K/uL 4.3   RBC 3.87 - 5.11 Mil/uL 4.02   Hemoglobin 12.0 - 15.0 g/dL 12.8   HCT 36.0 - 46.0 % 37.8   MCV 78.0 - 100.0 fl 94.0   MCHC 30.0 - 36.0 g/dL 33.7   RDW 11.5 - 15.5 % 13.0   Platelets 150.0 - 400.0 K/uL 251.0   Neutrophils Relative % 43.0 - 77.0 % 51.0   Lymphocytes Relative 12.0 - 46.0 % 35.3   Monocytes Relative 3.0 - 12.0 % 10.9   Eosinophils Relative 0.0 - 5.0 % 1.9   Basophils Relative 0.0 - 3.0 % 0.9   Neutro Abs 1.4 - 7.7 K/uL 2.2   Lymphs Abs 0.7 - 4.0 K/uL  1.5   Monocytes Absolute 0.1 - 1.0 K/uL 0.5   Eosinophils Absolute 0.0 - 0.7 K/uL 0.1   Basophils Absolute 0.0 - 0.1 K/uL 0.0     Sodium 135 - 145 mEq/L 139   Potassium 3.5 - 5.1 mEq/L 4.0   Chloride 96 - 112 mEq/L 102   CO2 19 - 32 mEq/L 29   Glucose, Bld 70 - 99 mg/dL 93   BUN 6 - 23 mg/dL 11   Creatinine, Ser 0.40 - 1.20 mg/dL 0.76   Total Bilirubin 0.2 - 1.2 mg/dL 0.6   Alkaline Phosphatase 39 - 117 U/L 45   AST 0 - 37 U/L 17   ALT 0 - 35 U/L 12   Total Protein 6.0 - 8.3 g/dL 6.9   Albumin 3.5 - 5.2 g/dL 4.4   GFR >60.00 mL/min  72.87   Comment: Calculated using the CKD-EPI Creatinine Equation (2021)  Calcium 8.4 - 10.5 mg/dL 9.4               Review of Systems  Constitutional: Negative for chills and fever.  Respiratory: Negative for cough.          Objective:   Physical Exam Exam conducted with a chaperone present.  Constitutional:      Appearance: Normal appearance.  Cardiovascular:     Rate and Rhythm: Normal rate and regular rhythm.     Pulses: Normal pulses.          Carotid pulses are 2+ on the right side and 2+ on the left side.    Heart sounds: Murmur heard.   Crescendo systolic murmur is present with a grade of 1/6.    Comments: Murmur over the aortic outflow tract.  Stable from August 2022 exam with Dr. Ubaldo Glassing and cardiology. No transmission of the carotids. Pulmonary:     Effort: Pulmonary effort is normal.     Breath sounds: Normal breath sounds.  Abdominal:     General: Abdomen is flat. Bowel sounds are normal.     Palpations: Abdomen is soft.     Tenderness: There is no abdominal tenderness.       Comments: Sausagelike role in the left lower quadrant, nontender, likely sigmoid.  Genitourinary:    Rectum: Guaiac result positive. No mass, tenderness, anal fissure or external hemorrhoid. Normal anal tone.     Comments: Anoscopy: Negative.  Stool: 8 mm palates, sticky, patches of red blood. Musculoskeletal:     Cervical back: Neck supple.  Skin:    General: Skin is warm and dry.  Neurological:     Mental Status: She is alert and oriented to person, place, and time.  Psychiatric:        Mood and Affect: Mood normal.        Behavior: Behavior normal.           Assessment:     Likely diverticulitis with history of antecedent diarrhea, less likely C. difficile colitis with long interval between single dose of antibiotic and onset of symptoms 3 and half weeks after the fact.    Plan:     The patient has remained afebrile throughout this, had just tiny bits of bright blood and  small amount of blood with the stool.  With the fairly red description this is likely from a lower source than higher and with the antecedent episode of diarrhea prior to the identification of blood, this is probably a episode of diverticulitis.   Based on her exam and vital signs, unlikely  benefit of antibiotics, but a CBC, C-reactive protein and sed rate will be obtained.   She will keep me apprised of her symptoms.  She may resume her psyllium and her daily aspirin.    This note is partially prepared by Ledell Noss, CMA acting as a scribe in the presence of Dr. Hervey Ard, MD.    The documentation recorded by the scribe accurately reflects the service I personally performed and the decisions made by me.    Robert Bellow, MD FACS  June 03, 2021 laboratory:  Collected 06/03/2021 11:06    Component Ref Range & Units 8 d ago   WBC (White Blood Cell Count) 4.1 - 10.2 103/uL 4.8   RBC (Red Blood Cell Count) 4.04 - 5.48 106/uL 4.03 Low    Hemoglobin 12.0 - 15.0 gm/dL 12.7   Hematocrit 35.0 - 47.0 % 37.8   MCV (Mean Corpuscular Volume) 80.0 - 100.0 fl 93.8   MCH (Mean Corpuscular Hemoglobin) 27.0 - 31.2 pg 31.5 High    MCHC (Mean Corpuscular Hemoglobin Concentration) 32.0 - 36.0 gm/dL 33.6   Platelet Count 150 - 450 103/uL 222   RDW-CV (Red Cell Distribution Width) 11.6 - 14.8 % 12.2   MPV (Mean Platelet Volume) 9.4 - 12.4 fl 9.1 Low    Neutrophils 1.50 - 7.80 103/uL 3.28   Lymphocytes 1.00 - 3.60 103/uL 1.02   Monocytes 0.00 - 1.50 103/uL 0.43   Eosinophils 0.00 - 0.55 103/uL 0.04   Basophils 0.00 - 0.09 103/uL 0.04   Neutrophil % 32.0 - 70.0 % 68.1   Lymphocyte % 10.0 - 50.0 % 21.2   Monocyte % 4.0 - 13.0 % 8.9   Eosinophil % 1.0 - 5.0 % 0.8 Low    Basophil% 0.0 - 2.0 % 0.8   Immature Granulocyte % <=0.7 % 0.2   Immature Granulocyte Count <=0.06 10^3/L 0.01

## 2021-06-15 ENCOUNTER — Other Ambulatory Visit: Payer: Self-pay | Admitting: General Surgery

## 2021-06-15 DIAGNOSIS — Z1231 Encounter for screening mammogram for malignant neoplasm of breast: Secondary | ICD-10-CM

## 2021-07-14 ENCOUNTER — Encounter: Payer: Self-pay | Admitting: General Surgery

## 2021-07-15 ENCOUNTER — Encounter: Payer: Self-pay | Admitting: General Surgery

## 2021-07-15 ENCOUNTER — Ambulatory Visit: Payer: Medicare Other | Admitting: Anesthesiology

## 2021-07-15 ENCOUNTER — Other Ambulatory Visit: Payer: Self-pay

## 2021-07-15 ENCOUNTER — Ambulatory Visit
Admission: RE | Admit: 2021-07-15 | Discharge: 2021-07-15 | Disposition: A | Discharge status: Home / Self Care | Payer: Medicare Other | Attending: General Surgery | Admitting: General Surgery

## 2021-07-15 ENCOUNTER — Encounter
Admission: RE | Disposition: A | Discharge status: Home / Self Care | Payer: Self-pay | Source: Home / Self Care | Attending: General Surgery

## 2021-07-15 DIAGNOSIS — Z952 Presence of prosthetic heart valve: Secondary | ICD-10-CM | POA: Insufficient documentation

## 2021-07-15 DIAGNOSIS — D120 Benign neoplasm of cecum: Secondary | ICD-10-CM | POA: Insufficient documentation

## 2021-07-15 DIAGNOSIS — Z79899 Other long term (current) drug therapy: Secondary | ICD-10-CM | POA: Insufficient documentation

## 2021-07-15 DIAGNOSIS — K625 Hemorrhage of anus and rectum: Secondary | ICD-10-CM | POA: Diagnosis not present

## 2021-07-15 DIAGNOSIS — E785 Hyperlipidemia, unspecified: Secondary | ICD-10-CM | POA: Diagnosis not present

## 2021-07-15 DIAGNOSIS — K5730 Diverticulosis of large intestine without perforation or abscess without bleeding: Secondary | ICD-10-CM | POA: Insufficient documentation

## 2021-07-15 DIAGNOSIS — D121 Benign neoplasm of appendix: Secondary | ICD-10-CM | POA: Insufficient documentation

## 2021-07-15 DIAGNOSIS — Z7982 Long term (current) use of aspirin: Secondary | ICD-10-CM | POA: Diagnosis not present

## 2021-07-15 DIAGNOSIS — F419 Anxiety disorder, unspecified: Secondary | ICD-10-CM | POA: Insufficient documentation

## 2021-07-15 DIAGNOSIS — I10 Essential (primary) hypertension: Secondary | ICD-10-CM | POA: Insufficient documentation

## 2021-07-15 HISTORY — DX: Dyspnea, unspecified: R06.00

## 2021-07-15 HISTORY — DX: Anxiety disorder, unspecified: F41.9

## 2021-07-15 HISTORY — DX: Hemorrhage of anus and rectum: K62.5

## 2021-07-15 HISTORY — PX: COLONOSCOPY WITH PROPOFOL: SHX5780

## 2021-07-15 HISTORY — DX: Nonrheumatic aortic (valve) stenosis: I35.0

## 2021-07-15 SURGERY — COLONOSCOPY WITH PROPOFOL
Anesthesia: General

## 2021-07-15 MED ORDER — LIDOCAINE HCL (CARDIAC) PF 100 MG/5ML IV SOSY
PREFILLED_SYRINGE | INTRAVENOUS | Status: DC | PRN
  Administered 2021-07-15: 60 mg via INTRAVENOUS

## 2021-07-15 MED ORDER — PROPOFOL 500 MG/50ML IV EMUL
INTRAVENOUS | Status: DC | PRN
  Administered 2021-07-15: 140 ug/kg/min via INTRAVENOUS

## 2021-07-15 MED ORDER — PROPOFOL 10 MG/ML IV BOLUS
INTRAVENOUS | Status: DC | PRN
  Administered 2021-07-15: 40 mg via INTRAVENOUS
  Administered 2021-07-15: 30 mg via INTRAVENOUS
  Administered 2021-07-15: 70 mg via INTRAVENOUS

## 2021-07-15 MED ORDER — SODIUM CHLORIDE 0.9 % IV SOLN: INTRAVENOUS | Status: DC

## 2021-07-15 NOTE — Anesthesia Postprocedure Evaluation (Signed)
Anesthesia Post Note  Patient: Sheila Osborne  Procedure(s) Performed: COLONOSCOPY WITH PROPOFOL  Patient location during evaluation: Endoscopy Anesthesia Type: General Level of consciousness: awake and alert Pain management: pain level controlled Vital Signs Assessment: post-procedure vital signs reviewed and stable Respiratory status: spontaneous breathing, nonlabored ventilation, respiratory function stable and patient connected to nasal cannula oxygen Cardiovascular status: blood pressure returned to baseline and stable Postop Assessment: no apparent nausea or vomiting Anesthetic complications: no   No notable events documented.   Last Vitals:  Vitals:   07/15/21 0904 07/15/21 0914  BP:  124/66  Pulse:    Resp:    Temp: (!) 36.1 C   SpO2:      Last Pain:  Vitals:   07/15/21 0925  TempSrc:   PainSc: 0-No pain                 Precious Haws Chasey Dull

## 2021-07-15 NOTE — Transfer of Care (Signed)
Immediate Anesthesia Transfer of Care Note  Patient: Sheila Osborne  Procedure(s) Performed: COLONOSCOPY WITH PROPOFOL  Patient Location: PACU  Anesthesia Type:General  Level of Consciousness: awake, alert  and oriented  Airway & Oxygen Therapy: Patient Spontanous Breathing  Post-op Assessment: Report given to RN and Post -op Vital signs reviewed and stable  Post vital signs: Reviewed and stable  Last Vitals:  Vitals Value Taken Time  BP 110/70 07/15/21 0904  Temp    Pulse 56 07/15/21 0904  Resp 14 07/15/21 0904  SpO2 98 % 07/15/21 0904  Vitals shown include unvalidated device data.  Last Pain:  Vitals:   07/15/21 0754  TempSrc: Temporal  PainSc: 0-No pain         Complications: No notable events documented.

## 2021-07-15 NOTE — H&P (Signed)
Sheila Osborne [id number] [birth year]     HPI:  86 y/o woman with a recent episode (early January 2023) of BRBPR.  For colonoscopy. Tolerated prep well, although she was up all night with frequent loose stools.   Medications Prior to Admission  Medication Sig Dispense Refill Last Dose   ALPRAZolam (XANAX) 0.5 MG tablet Take 0.5 mg by mouth at bedtime as needed for anxiety.      amLODipine (NORVASC) 10 MG tablet Take by mouth.   07/15/2021 at 0630   AMOXICILLIN PO Take by mouth.   07/15/2021 at 0630   cholecalciferol (VITAMIN D3) 25 MCG (1000 UNIT) tablet Take 50,000 Units by mouth once a week.      metoprolol succinate (TOPROL-XL) 25 MG 24 hr tablet Take 25 mg by mouth daily.   07/14/2021   aspirin 325 MG tablet Take 325 mg by mouth daily.      furosemide (LASIX) 20 MG tablet Take 20 mg by mouth as needed.      vitamin C (ASCORBIC ACID) 500 MG tablet Take 500 mg by mouth daily.      No Known Allergies Past Medical History:  Diagnosis Date   Allergy    Anxiety    Aortic stenosis    Arthritis    Bright red blood per rectum    Carotid bruit    Dyspnea    Heart murmur    Hyperlipidemia    Hypertension    Syncope 2012   Past Surgical History:  Procedure Laterality Date   AORTIC VALVE REPLACEMENT  04/26/2016   BREAST BIOPSY Left 1997   core- neg   CARDIAC CATHETERIZATION Bilateral 02/13/2016   Procedure: Right/Left Heart Cath and Coronary Angiography;  Surgeon: Teodoro Spray, MD;  Location: [facility];  Service: Cardiovascular;  Laterality: Bilateral;   CARDIAC VALVE REPLACEMENT     CATARACT EXTRACTION  2014   x2   COLONOSCOPY  2006   COLONOSCOPY WITH PROPOFOL N/A 08/25/2016   Procedure: COLONOSCOPY WITH PROPOFOL;  Surgeon: Robert Bellow, MD;  Location: [facility];  Service: Endoscopy;  Laterality: N/A;   Social History   Socioeconomic History   Marital status: Widowed    Spouse name: Not on file   Number of children: Not on file   Years of  education: Not on file   Highest education level: Not on file  Occupational History   Not on file  Tobacco Use   Smoking status: Never   Smokeless tobacco: Never  Substance and Sexual Activity   Alcohol use: Yes    Alcohol/week: 0.0 standard drinks    Comment: 1/day   Drug use: No   Sexual activity: Not Currently  Other Topics Concern   Not on file  Social History Narrative   Not on file   Social Determinants of Health   Financial Resource Strain: Low Risk    Difficulty of Paying Living Expenses: Not hard at all  Food Insecurity: No Food Insecurity   Worried About Charity fundraiser in the Last Year: Never true   [place] in the Last Year: Never true  Transportation Needs: No Transportation Needs   Lack of Transportation (Medical): No   Lack of Transportation (Non-Medical): No  Physical Activity: Not on file  Stress: No Stress Concern Present   Feeling of Stress : Not at all  Social Connections: Unknown   Frequency of Communication with Friends and Family: More than three times a week   Frequency of  Social Gatherings with Friends and Family: More than three times a week   Attends Religious Services: Not on Electrical engineer or Organizations: Not on file   Attends Archivist Meetings: Not on file   Marital Status: Not on file  Intimate Partner Violence: Not At Risk   Fear of Current or Ex-Partner: No   Emotionally Abused: No   Physically Abused: No   Sexually Abused: No   Social History   Social History Narrative   Not on file     ROS: Negative.     PE: HEENT: Negative. Lungs: Clear. Cardio: RR.  Assessment/Plan:  Proceed with planned colonoscopy.   [name] [facility] 07/15/2021

## 2021-07-15 NOTE — Anesthesia Preprocedure Evaluation (Signed)
Anesthesia Evaluation  Patient identified by MRN, date of birth, ID band Patient awake    Reviewed: Allergy & Precautions, NPO status , Patient's Chart, lab work & pertinent test results  History of Anesthesia Complications Negative for: history of anesthetic complications  Airway Mallampati: III  TM Distance: <3 FB Neck ROM: full    Dental  (+) Chipped   Pulmonary neg shortness of breath,    Pulmonary exam normal        Cardiovascular Exercise Tolerance: Good hypertension, Normal cardiovascular exam+ Valvular Problems/Murmurs      Neuro/Psych PSYCHIATRIC DISORDERS negative neurological ROS     GI/Hepatic negative GI ROS, Neg liver ROS,   Endo/Other  negative endocrine ROS  Renal/GU negative Renal ROS  negative genitourinary   Musculoskeletal  (+) Arthritis ,   Abdominal   Peds  Hematology negative hematology ROS (+)   Anesthesia Other Findings Past Medical History: No date: Allergy No date: Anxiety No date: Aortic stenosis No date: Arthritis No date: Bright red blood per rectum No date: Carotid bruit No date: Dyspnea No date: Heart murmur No date: Hyperlipidemia No date: Hypertension 2012: Syncope  Past Surgical History: 04/26/2016: AORTIC VALVE REPLACEMENT 1997: BREAST BIOPSY; Left     Comment:  core- neg 02/13/2016: CARDIAC CATHETERIZATION; Bilateral     Comment:  Procedure: Right/Left Heart Cath and Coronary               Angiography;  Surgeon: Teodoro Spray, MD;  Location:               [facility];  Service: Cardiovascular;                Laterality: Bilateral; No date: CARDIAC VALVE REPLACEMENT 2014: CATARACT EXTRACTION     Comment:  x2 2006: COLONOSCOPY 08/25/2016: COLONOSCOPY WITH PROPOFOL; N/A     Comment:  Procedure: COLONOSCOPY WITH PROPOFOL;  Surgeon: Robert Bellow, MD;  Location: [facility];  Service:               Endoscopy;  Laterality: N/A;  BMI     Body Mass Index: 22.38 kg/m      Reproductive/Obstetrics negative OB ROS                             Anesthesia Physical Anesthesia Plan  ASA: 2  Anesthesia Plan: General   Post-op Pain Management:    Induction: Intravenous  PONV Risk Score and Plan: Propofol infusion and TIVA  Airway Management Planned: Natural Airway and Nasal Cannula  Additional Equipment:   Intra-op Plan:   Post-operative Plan:   Informed Consent: I have reviewed the patients History and Physical, chart, labs and discussed the procedure including the risks, benefits and alternatives for the proposed anesthesia with the patient or authorized representative who has indicated his/her understanding and acceptance.     Dental Advisory Given  Plan Discussed with: Anesthesiologist, CRNA and Surgeon  Anesthesia Plan Comments: (Patient consented for risks of anesthesia including but not limited to:  - adverse reactions to medications - risk of airway placement if required - damage to eyes, teeth, lips or other oral mucosa - nerve damage due to positioning  - sore throat or hoarseness - Damage to heart, brain, nerves, lungs, other parts of body or loss of life  Patient voiced understanding.)        Anesthesia  Quick Evaluation

## 2021-07-15 NOTE — Op Note (Signed)
[facility] Gastroenterology Patient Name: Sheila Osborne Procedure Date: 07/15/2021 8:23 AM MRN: [id number] Account #: [id number] Date of Birth: [birth year] Admit Type: Outpatient Age: 86 Room: [facility] ENDO ROOM 1 Gender: Female Note Status: Finalized Instrument Name: Peds Colonoscope [id number] Procedure:             Colonoscopy Indications:           Rectal bleeding Providers:             Robert Bellow, MD Referring MD:          Deborra Medina, MD (Referring MD) Medicines:             Propofol per Anesthesia Complications:         No immediate complications. Procedure:             Pre-Anesthesia Assessment:                        - Prior to the procedure, a History and Physical was                         performed, and patient medications, allergies and                         sensitivities were reviewed. The patient's tolerance                         of previous anesthesia was reviewed.                        - The risks and benefits of the procedure and the                         sedation options and risks were discussed with the                         patient. All questions were answered and informed                         consent was obtained.                        After obtaining informed consent, the colonoscope was                         passed under direct vision. Throughout the procedure,                         the patient's blood pressure, pulse, and oxygen                         saturations were monitored continuously. The                         Colonoscope was introduced through the anus and                         advanced to the the cecum, identified by appendiceal                         orifice  and ileocecal valve. The colonoscopy was                         somewhat difficult due to multiple diverticula in the                         colon. The patient tolerated the procedure well. The                         quality of the bowel  preparation was good. Findings:      Multiple medium-mouthed diverticula were found in the sigmoid colon.      Two sessile polyps were found in the cecum and appendiceal orifice. The       polyps were 4 to 8 mm in size. These polyps were removed with a cold       biopsy forceps. Resection and retrieval were complete.      The retroflexed view of the distal rectum and anal verge was normal and       showed no anal or rectal abnormalities. Impression:            - Diverticulosis in the sigmoid colon.                        - Two 4 to 8 mm polyps in the cecum and at the                         appendiceal orifice, removed with a cold biopsy                         forceps. Resected and retrieved.                        - The distal rectum and anal verge are normal on                         retroflexion view. Recommendation:        - Telephone endoscopist for pathology results in 1                         week. Procedure Code(s):     --- Professional ---                        [phone], Colonoscopy, flexible; with biopsy, single or                         multiple Diagnosis Code(s):     --- Professional ---                        K62.5, Hemorrhage of anus and rectum                        K63.5, Polyp of colon                        K57.30, Diverticulosis of large intestine without                         perforation or abscess without bleeding CPT copyright  2019 American [employer]. All rights reserved. The codes documented in this report are preliminary and upon coder review may  be revised to meet current compliance requirements. Robert Bellow, MD 07/15/2021 9:04:26 AM This report has been signed electronically. Number of Addenda: 0 Note Initiated On: 07/15/2021 8:23 AM Scope Withdrawal Time: 0 hours 14 minutes 34 seconds  Total Procedure Duration: 0 hours 24 minutes 10 seconds  Estimated Blood Loss:  Estimated blood loss was minimal.      [facility]

## 2021-07-16 ENCOUNTER — Ambulatory Visit (INDEPENDENT_AMBULATORY_CARE_PROVIDER_SITE_OTHER): Payer: Medicare Other

## 2021-07-16 ENCOUNTER — Other Ambulatory Visit: Payer: Self-pay

## 2021-07-16 ENCOUNTER — Encounter: Payer: Self-pay | Admitting: General Surgery

## 2021-07-16 VITALS — Ht 64.5 in | Wt 133.0 lb

## 2021-07-16 DIAGNOSIS — Z0000 Encounter for general adult medical examination without abnormal findings: Secondary | ICD-10-CM

## 2021-07-16 DIAGNOSIS — I9789 Other postprocedural complications and disorders of the circulatory system, not elsewhere classified: Secondary | ICD-10-CM

## 2021-07-16 DIAGNOSIS — I10 Essential (primary) hypertension: Secondary | ICD-10-CM

## 2021-07-16 DIAGNOSIS — I4891 Unspecified atrial fibrillation: Secondary | ICD-10-CM

## 2021-07-16 DIAGNOSIS — Z780 Asymptomatic menopausal state: Secondary | ICD-10-CM

## 2021-07-16 DIAGNOSIS — E559 Vitamin D deficiency, unspecified: Secondary | ICD-10-CM

## 2021-07-16 LAB — SURGICAL PATHOLOGY

## 2021-07-16 NOTE — Progress Notes (Signed)
Subjective:   Sheila Osborne is a 86 y.o. female who presents for Medicare Annual (Subsequent) preventive examination.  Review of Systems    No ROS.  Medicare Wellness Virtual Visit.  Visual/audio telehealth visit, UTA vital signs.   See social history for additional risk factors.   Cardiac Risk Factors include: advanced age (>97men, >26 women)     Objective:    Today's Vitals   07/16/21 1319  Weight: 133 lb (60.3 kg)  Height: 5' 4.5" (1.638 m)   Body mass index is 22.48 kg/m.  Advanced Directives 07/16/2021 07/15/2021 08/25/2016 06/21/2016 12/20/2015  Does Patient Have a Medical Advance Directive? Yes Yes No No No  Type of Paramedic of [place];Living will [place];Living will - - -  Does patient want to make changes to medical advance directive? No - Patient declined - - - -  Copy of [place] in Chart? Yes - validated most recent copy scanned in chart (See row information) - - - -  Would patient like information on creating a medical advance directive? - - - Yes (MAU/Ambulatory/Procedural Areas - Information given) -   Current Medications (verified) Outpatient Encounter Medications as of 07/16/2021  Medication Sig   ALPRAZolam (XANAX) 0.5 MG tablet Take 0.5 mg by mouth at bedtime as needed for anxiety.   amLODipine (NORVASC) 10 MG tablet Take by mouth.   AMOXICILLIN PO Take by mouth.   aspirin 325 MG tablet Take 325 mg by mouth daily.   cholecalciferol (VITAMIN D3) 25 MCG (1000 UNIT) tablet Take 50,000 Units by mouth once a week.   furosemide (LASIX) 20 MG tablet Take 20 mg by mouth as needed.   metoprolol succinate (TOPROL-XL) 25 MG 24 hr tablet Take 25 mg by mouth daily.   vitamin C (ASCORBIC ACID) 500 MG tablet Take 500 mg by mouth daily.   No facility-administered encounter medications on file as of 07/16/2021.   Allergies (verified) Patient has no known allergies.   History: Past Medical History:   Diagnosis Date   Allergy    Anxiety    Aortic stenosis    Arthritis    Bright red blood per rectum    Carotid bruit    Dyspnea    Heart murmur    Hyperlipidemia    Hypertension    Syncope 2012   Past Surgical History:  Procedure Laterality Date   AORTIC VALVE REPLACEMENT  04/26/2016   BREAST BIOPSY Left 1997   core- neg   CARDIAC CATHETERIZATION Bilateral 02/13/2016   Procedure: Right/Left Heart Cath and Coronary Angiography;  Surgeon: Teodoro Spray, MD;  Location: [facility];  Service: Cardiovascular;  Laterality: Bilateral;   CARDIAC VALVE REPLACEMENT     CATARACT EXTRACTION  2014   x2   COLONOSCOPY  2006   COLONOSCOPY WITH PROPOFOL N/A 08/25/2016   Procedure: COLONOSCOPY WITH PROPOFOL;  Surgeon: Robert Bellow, MD;  Location: [facility];  Service: Endoscopy;  Laterality: N/A;   COLONOSCOPY WITH PROPOFOL N/A 07/15/2021   Procedure: COLONOSCOPY WITH PROPOFOL;  Surgeon: Robert Bellow, MD;  Location: [facility];  Service: Endoscopy;  Laterality: N/A;   Family History  Problem Relation Age of Onset   Hyperlipidemia Mother    Hypertension Mother    Stroke Mother    Cancer Father        stomach   Early death Father    Stroke Sister    Hypertension Sister    Stroke Sister  Hypertension Sister    Stroke Maternal Grandmother    Stroke Maternal Grandfather    Cancer Paternal Grandfather    Early death Paternal Grandfather    Breast cancer Neg Hx    Social History   Socioeconomic History   Marital status: Widowed    Spouse name: Not on file   Number of children: Not on file   Years of education: Not on file   Highest education level: Not on file  Occupational History   Not on file  Tobacco Use   Smoking status: Never   Smokeless tobacco: Never  Substance and Sexual Activity   Alcohol use: Yes    Alcohol/week: 0.0 standard drinks    Comment: 1/day   Drug use: No   Sexual activity: Not Currently  Other Topics Concern   Not on file   Social History Narrative   Not on file   Social Determinants of Health   Financial Resource Strain: Low Risk    Difficulty of Paying Living Expenses: Not hard at all  Food Insecurity: No Food Insecurity   Worried About Charity fundraiser in the Last Year: Never true   [place] in the Last Year: Never true  Transportation Needs: No Transportation Needs   Lack of Transportation (Medical): No   Lack of Transportation (Non-Medical): No  Physical Activity: Not on file  Stress: Not on file  Social Connections: Unknown   Frequency of Communication with Friends and Family: More than three times a week   Frequency of Social Gatherings with Friends and Family: More than three times a week   Attends Religious Services: Not on Electrical engineer or Organizations: Not on file   Attends Archivist Meetings: Not on file   Marital Status: Not on file    Tobacco Counseling Counseling given: Not Answered   Clinical Intake:  Pre-visit preparation completed: Yes        Diabetes: No  How often do you need to have someone help you when you read instructions, pamphlets, or other written materials from your doctor or pharmacy?: 1 - Never Interpreter Needed?: No      Activities of Daily Living In your present state of health, do you have any difficulty performing the following activities: 07/16/2021  Hearing? N  Vision? N  Difficulty concentrating or making decisions? N  Walking or climbing stairs? N  Dressing or bathing? N  Doing errands, shopping? N  Preparing Food and eating ? N  Using the Toilet? N  In the past six months, have you accidently leaked urine? N  Do you have problems with loss of bowel control? N  Managing your Medications? N  Managing your Finances? N  Housekeeping or managing your Housekeeping? N  Comment Maid assist as needed.  Some recent data might be hidden   Patient Care Team: Crecencio Mc, MD as PCP - General (Internal  Medicine) Bary Castilla, [name], MD (General Surgery) Lenard Simmer, MD as Attending Physician (Endocrinology)  Indicate any recent Medical Services you may have received from other than [facility] providers in the past year (date may be approximate).     Assessment:   This is a routine wellness examination for Sheila Osborne.  Virtual Visit via Telephone Note  I connected with  Sheila Osborne on 07/16/21 at  1:15 PM EST by telephone and verified that I am speaking with the correct person using two identifiers.  Persons participating in the virtual visit:  patient/Nurse Health Advisor   I discussed the limitations, risks, security and privacy concerns of performing an evaluation and management service by telephone and the availability of in person appointments. The patient expressed understanding and agreed to proceed.  Interactive audio and video telecommunications were attempted between this nurse and patient, however failed, due to patient having technical difficulties OR patient did not have access to video capability.  We continued and completed visit with audio only.  Some vital signs may be absent or patient reported.   Hearing/Vision screen Hearing Screening - Comments:: Notes some difficulty hearing conversational tones. No hearing aids worn. Followed by audiology.   Dietary issues and exercise activities discussed: Current Exercise Habits: Home exercise routine, Type of exercise: walking;yoga;calisthenics, Frequency (Times/Week): 5, Intensity: Mild  Healthy diet Lean meats Good water intake   Goals Addressed               This Visit's Progress     Patient Stated     Maintain healthy lifestyle (pt-stated)        Stay active Healthy diet       Depression Screen PHQ 2/9 Scores 07/16/2021 12/24/2020 07/17/2020 07/15/2020 08/28/2019 06/21/2016  PHQ - 2 Score 0 0 0 0 1 0  PHQ- 9 Score - - - - - 0    Fall Risk Fall Risk  07/16/2021 12/24/2020 12/17/2020 07/17/2020 08/28/2019   Falls in the past year? 0 1 1 0 0  Number falls in past yr: - 0 0 0 -  Injury with Fall? - 1 1 0 -  Risk for fall due to : - History of fall(s) - - -  Follow up Falls evaluation completed Falls evaluation completed Falls evaluation completed Falls evaluation completed Falls evaluation completed    [place]: Home free of loose throw rugs in walkways, pet beds, electrical cords, etc? Yes  Adequate lighting in your home to reduce risk of falls? Yes   ASSISTIVE DEVICES UTILIZED TO PREVENT FALLS: Life alert? No  Use of a cane, walker or w/c? No   TIMED UP AND GO: Was the test performed? No .   Cognitive Function:  Patient is alert and oriented x3.  Enjoys painting 3-4 days weekly, volunteering, playing brain games, reading, socializes and other brain health stimulating activities.  Manages her own finances and medications without issues.      Immunizations Immunization History  Administered Date(s) Administered   Fluad Quad(high Dose 65+) 05/21/2021   Influenza-Unspecified 03/27/2019, 03/20/2020   PFIZER(Purple Top)SARS-COV-2 Vaccination 06/21/2019, 07/12/2019, 03/17/2020, 10/13/2020   Pneumococcal Conjugate-13 07/17/2020   TDAP status: Due, Education has been provided regarding the importance of this vaccine. Advised may receive this vaccine at local pharmacy or Health Dept. Aware to provide a copy of the vaccination record if obtained from local pharmacy or Health Dept. Verbalized acceptance and understanding. Deferred.   Shingrix Completed?: No.    Education has been provided regarding the importance of this vaccine. Patient has been advised to call insurance company to determine out of pocket expense if they have not yet received this vaccine. Advised may also receive vaccine at local pharmacy or Health Dept. Verbalized acceptance and understanding.  Screening Tests Health Maintenance  Topic Date Due   DEXA SCAN  Never done   Pneumonia  Vaccine 51+ Years old (2 - PPSV23 if available, else PCV20) 07/17/2021   COVID-19 Vaccine (5 - Booster for Pfizer series) 08/01/2021 (Originally 12/08/2020)   Zoster Vaccines- Shingrix (1 of 2)  10/13/2021 (Originally 05/02/1955)   TETANUS/TDAP  07/16/2022 (Originally 05/02/1955)   INFLUENZA VACCINE  Completed   HPV VACCINES  Aged Out   Health Maintenance Health Maintenance Due  Topic Date Due   DEXA SCAN  Never done   Pneumonia Vaccine 32+ Years old (2 - PPSV23 if available, else PCV20) 07/17/2021   Mammogram- scheduled 07/28/21.   Bone density- consent given. Ordered today. Patient to call and schedule same day as mammogram if possible.   Lung Cancer Screening: (Low Dose CT Chest recommended if Age 50-80 years, 30 pack-year currently smoking OR have quit w/in 15years.) does not qualify.   Hepatitis C Screening: does not qualify.  Vision Screening: Recommended annual ophthalmology exams for early detection of glaucoma and other disorders of the eye.  Dental Screening: Recommended annual dental exams for proper oral hygiene. Visits every 6 months.   Community Resource Referral / Chronic Care Management: CRR required this visit?  No   CCM required this visit?  No      Plan:   Keep all routine maintenance appointments.  Next scheduled lab 12/10/21 @ 8:00 Cpe 12/17/21 @ 9:00  I have personally reviewed and noted the following in the patients chart:   Medical and social history Use of alcohol, tobacco or illicit drugs  Current medications and supplements including opioid prescriptions. Not taking opioid.  Functional ability and status Nutritional status Physical activity Advanced directives List of other physicians Hospitalizations, surgeries, and ER visits in previous 12 months Vitals Screenings to include cognitive, depression, and falls Referrals and appointments  In addition, I have reviewed and discussed with patient certain preventive protocols, quality metrics, and  best practice recommendations. A written personalized care plan for preventive services as well as general preventive health recommendations were provided to patient via mychart.     Varney Biles, LPN   0/12/3708

## 2021-07-16 NOTE — Patient Instructions (Addendum)
Sheila Osborne , Thank you for taking time to come for your Medicare Wellness Visit. I appreciate your ongoing commitment to your health goals. Please review the following plan we discussed and let me know if I can assist you in the future.   These are the goals we discussed:  Goals       Patient Stated     Maintain healthy lifestyle (pt-stated)      Stay active Healthy diet        This is a list of the screening recommended for you and due dates:  Health Maintenance  Topic Date Due   DEXA scan (bone density measurement)  Never done   Pneumonia Vaccine (2 - PPSV23 if available, else PCV20) 07/17/2021   COVID-19 Vaccine (5 - Booster for Pfizer series) 08/01/2021*   Zoster (Shingles) Vaccine (1 of 2) 10/13/2021*   Tetanus Vaccine  07/16/2022*   Flu Shot  Completed   HPV Vaccine  Aged Out  *Topic was postponed. The date shown is not the original due date.    Keep all routine maintenance appointments.   Next scheduled lab 12/10/21 @ 8:00  Cpe 12/17/21 @ 9:00  Call [place] to schedule Dexa Scan same day as mammogram if possible.   Visit local pharmacy to receive TDAP vaccine and Shingrix vaccine.   Advanced directives: on file.  Conditions/risks identified: none new.  Follow up in one year for your annual wellness visit    Preventive Care 65 Years and Older, Female Preventive care refers to lifestyle choices and visits with your health care provider that can promote health and wellness. What does preventive care include? A yearly physical exam. This is also called an annual well check. Dental exams once or twice a year. Routine eye exams. Ask your health care provider how often you should have your eyes checked. Personal lifestyle choices, including: Daily care of your teeth and gums. Regular physical activity. Eating a healthy diet. Avoiding tobacco and drug use. Limiting alcohol use. Practicing safe sex. Taking low-dose aspirin every day. Taking vitamin and mineral  supplements as recommended by your health care provider. What happens during an annual well check? The services and screenings done by your health care provider during your annual well check will depend on your age, overall health, lifestyle risk factors, and family history of disease. Counseling  Your health care provider may ask you questions about your: Alcohol use. Tobacco use. Drug use. Emotional well-being. Home and relationship well-being. Sexual activity. Eating habits. History of falls. Memory and ability to understand (cognition). Work and work Statistician. Reproductive health. Screening  You may have the following tests or measurements: Height, weight, and BMI. Blood pressure. Lipid and cholesterol levels. These may be checked every 5 years, or more frequently if you are over 27 years old. Skin check. Lung cancer screening. You may have this screening every year starting at age 84 if you have a 30-pack-year history of smoking and currently smoke or have quit within the past 15 years. Fecal occult blood test (FOBT) of the stool. You may have this test every year starting at age 84. Flexible sigmoidoscopy or colonoscopy. You may have a sigmoidoscopy every 5 years or a colonoscopy every 10 years starting at age 84. Hepatitis C blood test. Hepatitis B blood test. Sexually transmitted disease (STD) testing. Diabetes screening. This is done by checking your blood sugar (glucose) after you have not eaten for a while (fasting). You may have this done every 1-3 years. Bone density scan.  This is done to screen for osteoporosis. You may have this done starting at age 25. Mammogram. This may be done every 1-2 years. Talk to your health care provider about how often you should have regular mammograms. Talk with your health care provider about your test results, treatment options, and if necessary, the need for more tests. Vaccines  Your health care provider may recommend certain  vaccines, such as: Influenza vaccine. This is recommended every year. Tetanus, diphtheria, and acellular pertussis (Tdap, Td) vaccine. You may need a Td booster every 10 years. Zoster vaccine. You may need this after age 84. Pneumococcal 13-valent conjugate (PCV13) vaccine. One dose is recommended after age 84. Pneumococcal polysaccharide (PPSV23) vaccine. One dose is recommended after age 84. Talk to your health care provider about which screenings and vaccines you need and how often you need them. This information is not intended to replace advice given to you by your health care provider. Make sure you discuss any questions you have with your health care provider. Document Released: 06/20/2015 Document Revised: 02/11/2016 Document Reviewed: 03/25/2015 Elsevier Interactive Patient Education  2017 [place] Prevention in the Home Falls can cause injuries. They can happen to people of all ages. There are many things you can do to make your home safe and to help prevent falls. What can I do on the outside of my home? Regularly fix the edges of walkways and driveways and fix any cracks. Remove anything that might make you trip as you walk through a door, such as a raised step or threshold. Trim any bushes or trees on the path to your home. Use bright outdoor lighting. Clear any walking paths of anything that might make someone trip, such as rocks or tools. Regularly check to see if handrails are loose or broken. Make sure that both sides of any steps have handrails. Any raised decks and porches should have guardrails on the edges. Have any leaves, snow, or ice cleared regularly. Use sand or salt on walking paths during winter. Clean up any spills in your garage right away. This includes oil or grease spills. What can I do in the bathroom? Use night lights. Install grab bars by the toilet and in the tub and shower. Do not use towel bars as grab bars. Use non-skid mats or decals in  the tub or shower. If you need to sit down in the shower, use a plastic, non-slip stool. Keep the floor dry. Clean up any water that spills on the floor as soon as it happens. Remove soap buildup in the tub or shower regularly. Attach bath mats securely with double-sided non-slip rug tape. Do not have throw rugs and other things on the floor that can make you trip. What can I do in the bedroom? Use night lights. Make sure that you have a light by your bed that is easy to reach. Do not use any sheets or blankets that are too big for your bed. They should not hang down onto the floor. Have a firm chair that has side arms. You can use this for support while you get dressed. Do not have throw rugs and other things on the floor that can make you trip. What can I do in the kitchen? Clean up any spills right away. Avoid walking on wet floors. Keep items that you use a lot in easy-to-reach places. If you need to reach something above you, use a strong step stool that has a grab bar. Keep electrical cords  out of the way. Do not use floor polish or wax that makes floors slippery. If you must use wax, use non-skid floor wax. Do not have throw rugs and other things on the floor that can make you trip. What can I do with my stairs? Do not leave any items on the stairs. Make sure that there are handrails on both sides of the stairs and use them. Fix handrails that are broken or loose. Make sure that handrails are as long as the stairways. Check any carpeting to make sure that it is firmly attached to the stairs. Fix any carpet that is loose or worn. Avoid having throw rugs at the top or bottom of the stairs. If you do have throw rugs, attach them to the floor with carpet tape. Make sure that you have a light switch at the top of the stairs and the bottom of the stairs. If you do not have them, ask someone to add them for you. What else can I do to help prevent falls? Wear shoes that: Do not have high  heels. Have rubber bottoms. Are comfortable and fit you well. Are closed at the toe. Do not wear sandals. If you use a stepladder: Make sure that it is fully opened. Do not climb a closed stepladder. Make sure that both sides of the stepladder are locked into place. Ask someone to hold it for you, if possible. Clearly mark and make sure that you can see: Any grab bars or handrails. First and last steps. Where the edge of each step is. Use tools that help you move around (mobility aids) if they are needed. These include: Canes. Walkers. Scooters. Crutches. Turn on the lights when you go into a dark area. Replace any light bulbs as soon as they burn out. Set up your furniture so you have a clear path. Avoid moving your furniture around. If any of your floors are uneven, fix them. If there are any pets around you, be aware of where they are. Review your medicines with your doctor. Some medicines can make you feel dizzy. This can increase your chance of falling. Ask your doctor what other things that you can do to help prevent falls. This information is not intended to replace advice given to you by your health care provider. Make sure you discuss any questions you have with your health care provider. Document Released: 03/20/2009 Document Revised: 10/30/2015 Document Reviewed: 06/28/2014 Elsevier Interactive Patient Education  2017 [employer].

## 2021-07-28 ENCOUNTER — Other Ambulatory Visit: Payer: Self-pay

## 2021-07-28 ENCOUNTER — Ambulatory Visit
Admission: RE | Admit: 2021-07-28 | Discharge: 2021-07-28 | Disposition: A | Discharge status: Home / Self Care | Payer: Medicare Other | Source: Ambulatory Visit | Attending: General Surgery | Admitting: General Surgery

## 2021-07-28 DIAGNOSIS — Z1231 Encounter for screening mammogram for malignant neoplasm of breast: Secondary | ICD-10-CM | POA: Insufficient documentation

## 2021-08-31 ENCOUNTER — Other Ambulatory Visit: Payer: Self-pay

## 2021-08-31 ENCOUNTER — Ambulatory Visit
Admission: RE | Admit: 2021-08-31 | Discharge: 2021-08-31 | Disposition: A | Discharge status: Home / Self Care | Payer: Medicare Other | Source: Ambulatory Visit | Attending: Internal Medicine | Admitting: Internal Medicine

## 2021-08-31 DIAGNOSIS — Z780 Asymptomatic menopausal state: Secondary | ICD-10-CM | POA: Insufficient documentation

## 2021-12-10 ENCOUNTER — Other Ambulatory Visit (INDEPENDENT_AMBULATORY_CARE_PROVIDER_SITE_OTHER): Payer: Medicare Other

## 2021-12-10 DIAGNOSIS — I4891 Unspecified atrial fibrillation: Secondary | ICD-10-CM | POA: Diagnosis not present

## 2021-12-10 DIAGNOSIS — I9789 Other postprocedural complications and disorders of the circulatory system, not elsewhere classified: Secondary | ICD-10-CM

## 2021-12-10 DIAGNOSIS — E559 Vitamin D deficiency, unspecified: Secondary | ICD-10-CM

## 2021-12-10 DIAGNOSIS — I10 Essential (primary) hypertension: Secondary | ICD-10-CM | POA: Diagnosis not present

## 2021-12-10 LAB — CBC WITH DIFFERENTIAL/PLATELET
Basophils Absolute: 0 10*3/uL (ref 0.0–0.1)
Basophils Relative: 0.9 % (ref 0.0–3.0)
Eosinophils Absolute: 0.1 10*3/uL (ref 0.0–0.7)
Eosinophils Relative: 2.6 % (ref 0.0–5.0)
HCT: 38.2 % (ref 36.0–46.0)
Hemoglobin: 12.8 g/dL (ref 12.0–15.0)
Lymphocytes Relative: 40.8 % (ref 12.0–46.0)
Lymphs Abs: 1.6 10*3/uL (ref 0.7–4.0)
MCHC: 33.5 g/dL (ref 30.0–36.0)
MCV: 93.5 fl (ref 78.0–100.0)
Monocytes Absolute: 0.4 10*3/uL (ref 0.1–1.0)
Monocytes Relative: 10.3 % (ref 3.0–12.0)
Neutro Abs: 1.7 10*3/uL (ref 1.4–7.7)
Neutrophils Relative %: 45.4 % (ref 43.0–77.0)
Platelets: 248 10*3/uL (ref 150.0–400.0)
RBC: 4.09 Mil/uL (ref 3.87–5.11)
RDW: 13.1 % (ref 11.5–15.5)
WBC: 3.8 10*3/uL — ABNORMAL LOW (ref 4.0–10.5)

## 2021-12-10 LAB — LIPID PANEL
Cholesterol: 242 mg/dL — ABNORMAL HIGH (ref 0–200)
HDL: 85.8 mg/dL (ref >39.00)
LDL Cholesterol: 137 mg/dL — ABNORMAL HIGH (ref 0–99)
NonHDL: 156.07
Total CHOL/HDL Ratio: 3
Triglycerides: 94 mg/dL (ref 0.0–149.0)
VLDL: 18.8 mg/dL (ref 0.0–40.0)

## 2021-12-10 LAB — COMPREHENSIVE METABOLIC PANEL
ALT: 10 U/L (ref 0–35)
AST: 15 U/L (ref 0–37)
Albumin: 4.2 g/dL (ref 3.5–5.2)
Alkaline Phosphatase: 42 U/L (ref 39–117)
BUN: 11 mg/dL (ref 6–23)
CO2: 31 mEq/L (ref 19–32)
Calcium: 9.3 mg/dL (ref 8.4–10.5)
Chloride: 103 mEq/L (ref 96–112)
Creatinine, Ser: 0.77 mg/dL (ref 0.40–1.20)
GFR: 71.24 mL/min (ref >60.00)
Glucose, Bld: 97 mg/dL (ref 70–99)
Potassium: 3.8 mEq/L (ref 3.5–5.1)
Sodium: 140 mEq/L (ref 135–145)
Total Bilirubin: 0.6 mg/dL (ref 0.2–1.2)
Total Protein: 6.6 g/dL (ref 6.0–8.3)

## 2021-12-10 LAB — VITAMIN D 25 HYDROXY (VIT D DEFICIENCY, FRACTURES): VITD: 84.9 ng/mL (ref 30.00–100.00)

## 2021-12-10 LAB — TSH: TSH: 1.39 u[IU]/mL (ref 0.35–5.50)

## 2021-12-17 ENCOUNTER — Encounter: Payer: Self-pay | Admitting: Internal Medicine

## 2021-12-17 ENCOUNTER — Ambulatory Visit (INDEPENDENT_AMBULATORY_CARE_PROVIDER_SITE_OTHER): Payer: Medicare Other | Admitting: Internal Medicine

## 2021-12-17 DIAGNOSIS — M8580 Other specified disorders of bone density and structure, unspecified site: Secondary | ICD-10-CM

## 2021-12-17 DIAGNOSIS — Z1239 Encounter for other screening for malignant neoplasm of breast: Secondary | ICD-10-CM

## 2021-12-17 DIAGNOSIS — Z952 Presence of prosthetic heart valve: Secondary | ICD-10-CM | POA: Diagnosis not present

## 2021-12-17 DIAGNOSIS — D126 Benign neoplasm of colon, unspecified: Secondary | ICD-10-CM

## 2021-12-17 DIAGNOSIS — Z780 Asymptomatic menopausal state: Secondary | ICD-10-CM | POA: Diagnosis not present

## 2021-12-17 MED ORDER — ROSUVASTATIN CALCIUM 10 MG PO TABS: 10.0000 mg | ORAL_TABLET | ORAL | 3 refills | Status: DC

## 2021-12-17 MED ORDER — TETANUS-DIPHTH-ACELL PERTUSSIS 5-2.5-18.5 LF-MCG/0.5 IM SUSY: 0.5000 mL | PREFILLED_SYRINGE | Freq: Once | INTRAMUSCULAR | 0 refills | Status: AC

## 2021-12-17 NOTE — Assessment & Plan Note (Addendum)
Unchanged by  2023 DEXA

## 2021-12-17 NOTE — Assessment & Plan Note (Addendum)
Found during diagnostic colonoscopy Feb 2023 done bc of rectal bleeding follow up 3 years

## 2021-12-17 NOTE — Progress Notes (Signed)
Patient ID: Sheila Osborne, female    DOB: 12-Feb-1936  Age: 86 y.o. MRN: [id number]  The patient is here for follow up and  management of other chronic and acute problems.   The risk factors are reflected in the social history.  The roster of all physicians providing medical care to patient - is listed in the Snapshot section of the chart.  Activities of daily living:  The patient is 100% independent in all ADLs: dressing, toileting, feeding as well as independent mobility  Home safety : The patient has smoke detectors in the home. They wear seatbelts.  There are no firearms at home. There is no violence in the home.   There is no risks for hepatitis, STDs or HIV. There is no   history of blood transfusion. They have no travel history to infectious disease endemic areas of the world.  The patient has seen their dentist in the last six month. They have seen their eye doctor in the last year. They admit to slight hearing difficulty with regard to whispered voices and some television programs.  They have deferred audiologic testing in the last year.  They do not  have excessive sun exposure. Discussed the need for sun protection: hats, long sleeves and use of sunscreen if there is significant sun exposure.   Diet: the importance of a healthy diet is discussed. They do have a healthy diet.  The benefits of regular aerobic exercise were discussed. She walks 70000 steps daily ,  exercises for 25 mintues and yoga for 15 minutes  6 days per week.  No balance issues,  no falls.     Depression screen: there are no signs or vegative symptoms of depression- irritability, change in appetite, anhedonia, sadness/tearfullness.  Visits with her 2 sister in [place] every night via [place]   Cognitive assessment: the patient manages all their financial and personal affairs and is actively engaged. They could relate day,date,year and events; recalled 2/3 objects at 3 minutes; performed clock-face test  normally.  The following portions of the patient's history were reviewed and updated as appropriate: allergies, current medications, past family history, past medical history,  past surgical history, past social history  and problem list.  Visual acuity was not assessed per patient preference since she has regular follow up with her ophthalmologist. Hearing and body mass index were assessed and reviewed.   During the course of the visit the patient was educated and counseled about appropriate screening and preventive services including : fall prevention , diabetes screening, nutrition counseling, colorectal cancer screening, and recommended immunizations.    CC: Diagnoses of Osteopenia after menopause, Tubular adenoma of colon, S/P AVR, and Breast screening were pertinent to this visit.  Vitamin D supplementation taking 20, 000 units of D3 Sees Benitas Graham semi annually   History Sheila Osborne has a past medical history of Allergy, Anxiety, Aortic stenosis, Arthritis, Bright red blood per rectum, Carotid bruit, Dyspnea, Heart murmur, Hyperlipidemia, Hypertension, and Syncope (2012). She has a past surgical history that includes Colonoscopy (2006); Cataract extraction (2014); Cardiac catheterization (Bilateral, 02/13/2016); Aortic valve replacement (04/26/2016); Cardiac valve replacement; Colonoscopy with propofol (N/A, 08/25/2016); Breast biopsy (Left, 1997); and Colonoscopy with propofol (N/A, 07/15/2021).   Her family history includes Cancer in her father and paternal grandfather; Early death in her father and paternal grandfather; Hyperlipidemia in her mother; Hypertension in her mother, sister, and sister; Stroke in her maternal grandfather, maternal grandmother, mother, sister, and sister.She reports that she has never smoked. She has  never used smokeless tobacco. She reports current alcohol use. She reports that she does not use drugs.  Outpatient Medications Prior to Visit  Medication Sig Dispense  Refill   amLODipine (NORVASC) 5 MG tablet Take 5 mg by mouth 2 (two) times daily.     AMOXICILLIN PO Take by mouth.     aspirin 325 MG tablet Take 325 mg by mouth daily.     furosemide (LASIX) 20 MG tablet Take 20 mg by mouth as needed.     metoprolol succinate (TOPROL-XL) 25 MG 24 hr tablet Take 12.5 mg by mouth daily.     vitamin C (ASCORBIC ACID) 500 MG tablet Take 500 mg by mouth daily.     metoprolol succinate (TOPROL-XL) 25 MG 24 hr tablet Take 1 tablet by mouth daily.     ALPRAZolam (XANAX) 0.5 MG tablet Take 0.5 mg by mouth at bedtime as needed for anxiety.     amLODipine (NORVASC) 10 MG tablet Take by mouth. (Patient not taking: Reported on 12/17/2021)     cholecalciferol (VITAMIN D3) 25 MCG (1000 UNIT) tablet Take 50,000 Units by mouth once a week.     No facility-administered medications prior to visit.    Review of Systems  Patient denies headache, fevers, malaise, unintentional weight loss, skin rash, eye pain, sinus congestion and sinus pain, sore throat, dysphagia,  hemoptysis , cough, dyspnea, wheezing, chest pain, palpitations, orthopnea, edema, abdominal pain, nausea, melena, diarrhea, constipation, flank pain, dysuria, hematuria, urinary  Frequency, nocturia, numbness, tingling, seizures,  Focal weakness, Loss of consciousness,  Tremor, insomnia, depression, anxiety, and suicidal ideation.     Objective:  BP 138/74 (BP Location: Left Arm, Patient Position: Sitting, Cuff Size: Normal)   Pulse 70   Temp 97.7 F (36.5 C) (Oral)   Ht 5' 4.5" (1.638 m)   Wt 138 lb 12.8 oz (63 kg)   SpO2 99%   BMI 23.46 kg/m   Physical Exam  General appearance: alert, cooperative and appears stated age Ears: normal TM's and external ear canals both ears Throat: lips, mucosa, and tongue normal; teeth and gums normal Neck: no adenopathy, no carotid bruit, supple, symmetrical, trachea midline and thyroid not enlarged, symmetric, no tenderness/mass/nodules Back: symmetric, no curvature.  ROM normal. No CVA tenderness. Lungs: clear to auscultation bilaterally Heart: regular rate and rhythm, S1, S2 normal, no murmur, click, rub or gallop Abdomen: soft, non-tender; bowel sounds normal; no masses,  no organomegaly Pulses: 2+ and symmetric Skin: Skin color, texture, turgor normal. No rashes or lesions Lymph nodes: Cervical, supraclavicular, and axillary nodes normal.   Assessment & Plan:   Problem List Items Addressed This Visit     Tubular adenoma of colon    Found during diagnostic colonoscopy Feb 2023 done bc of rectal bleeding follow up 3 years       Osteopenia after menopause    Unchanged by  2023 DEXA       Breast screening    She has continued breast cancer screening. Discussed rationale for continued screening vs stopping       S/P AVR    Congenitally bicuspid valve,  S/p AV replacement  in 2017 with a 25 mm Carpentier-Edwards magna bioprosthetic valve        I provided  30 minutes  during this encounter reviewing patient's current problems and past surgeries, labs and imaging studies, providing counseling on the above mentioned problems I na face to face visit  , and coordination  of care .  Meds ordered  this encounter  Medications   rosuvastatin (CRESTOR) 10 MG tablet    Sig: Take 1 tablet (10 mg total) by mouth once a week.    Dispense:  12 tablet    Refill:  3   Tdap (BOOSTRIX) 5-2.5-18.5 LF-MCG/0.5 injection    Sig: Inject 0.5 mLs into the muscle once for 1 dose.    Dispense:  0.5 mL    Refill:  0    Medications Discontinued During This Encounter  Medication Reason   amLODipine (NORVASC) 10 MG tablet Change in therapy   ALPRAZolam (XANAX) 0.5 MG tablet Patient Preference   cholecalciferol (VITAMIN D3) 25 MCG (1000 UNIT) tablet Patient Preference   metoprolol succinate (TOPROL-XL) 25 MG 24 hr tablet Duplicate    Follow-up: Return in about 1 year (around 12/18/2022).   Crecencio Mc, MD

## 2021-12-17 NOTE — Patient Instructions (Addendum)
Reduce your vitamin D intake from 20,000 IUs to 10,000  daily   your level keeps rising!    I strongly recommend that you get the TDaP vaccine at  your pharmacy .  Medicare will pay for it .  It is good for ten years for protection against tetanus,  And for lifetime protection against  diptheria and whooping cough    I'm glad you are willing to try taking  Crestor once a week as a preventive medication against stroke

## 2021-12-19 NOTE — Assessment & Plan Note (Signed)
She has continued breast cancer screening. Discussed rationale for continued screening vs stopping

## 2021-12-19 NOTE — Assessment & Plan Note (Signed)
Congenitally bicuspid valve,  S/p AV replacement  in 2017 with a 25 mm Carpentier-Edwards magna bioprosthetic valve

## 2022-03-20 ENCOUNTER — Emergency Department
Admission: EM | Admit: 2022-03-20 | Discharge: 2022-03-20 | Disposition: A | Discharge status: Home / Self Care | Payer: Medicare Other | Attending: Emergency Medicine | Admitting: Emergency Medicine

## 2022-03-20 ENCOUNTER — Emergency Department: Payer: Medicare Other

## 2022-03-20 ENCOUNTER — Encounter: Payer: Self-pay | Admitting: Emergency Medicine

## 2022-03-20 ENCOUNTER — Other Ambulatory Visit: Payer: Self-pay

## 2022-03-20 DIAGNOSIS — I10 Essential (primary) hypertension: Secondary | ICD-10-CM | POA: Diagnosis not present

## 2022-03-20 DIAGNOSIS — S2232XA Fracture of one rib, left side, initial encounter for closed fracture: Secondary | ICD-10-CM | POA: Insufficient documentation

## 2022-03-20 DIAGNOSIS — W010XXA Fall on same level from slipping, tripping and stumbling without subsequent striking against object, initial encounter: Secondary | ICD-10-CM | POA: Insufficient documentation

## 2022-03-20 DIAGNOSIS — S299XXA Unspecified injury of thorax, initial encounter: Secondary | ICD-10-CM | POA: Diagnosis present

## 2022-03-20 MED ORDER — LIDOCAINE 5 % EX PTCH
1.0000 | MEDICATED_PATCH | CUTANEOUS | Status: DC
  Administered 2022-03-20: 1 via TRANSDERMAL
  Filled 2022-03-20: qty 1

## 2022-03-20 MED ORDER — LIDOCAINE 5 % EX PTCH: 1.0000 | MEDICATED_PATCH | Freq: Two times a day (BID) | CUTANEOUS | 0 refills | Status: DC

## 2022-03-20 NOTE — Discharge Instructions (Signed)
Continue taking Tylenol as needed for pain.  A prescription for Lidoderm patches was sent to your pharmacy to use in this area.  Please take deep breaths frequently to prevent pneumonia.  Follow-up with your PCP if any continued problems.  Return to the emergency department if any severe worsening of your symptoms.

## 2022-03-20 NOTE — ED Triage Notes (Signed)
Pt reports last pm she tripped and fell and continues to have pain to her left side ribs. Pt denies LOC or hitting head. Pt reports had syncopal episode due to the pain and called EMS.

## 2022-03-20 NOTE — ED Provider Notes (Signed)
[facility] Provider Note    Event Date/Time   First MD Initiated Contact with Patient 03/20/22 1110     (approximate)   History   Rib Injury   HPI  Sheila Osborne is a 86 y.o. female   presents to the ED with complaint of tripping and falling last p.m. with injury to her left ribs.  Patient denies any LOC or hitting her head.  She continues to have some left rib pain with deep inspiration.  No other complaints.  Patient reports she had a syncopal episode due to the pain and called EMS.  Patient has a history of hypertension, osteopenia, CVA, postoperative A-fib, aortic valve disease with aortic valve replacement.      Physical Exam   Triage Vital Signs: ED Triage Vitals [03/20/22 1041]  Enc Vitals Group     BP (!) 165/97     Pulse Rate 87     Resp 19     Temp 98.5 F (36.9 C)     Temp Source Oral     SpO2 98 %     Weight 133 lb (60.3 kg)     Height '5\' 5"'$  (1.651 m)     Head Circumference      Peak Flow      Pain Score 9     Pain Loc      Pain Edu?      Excl. in [place]?     Most recent vital signs: Vitals:   03/20/22 1041  BP: (!) 165/97  Pulse: 87  Resp: 19  Temp: 98.5 F (36.9 C)  SpO2: 98%     General: Awake, no distress.  Alert, talkative. CV:  Good peripheral perfusion.  Heart regular rate and rhythm. Resp:  Normal effort.  Lungs are clear bilaterally.  There is tenderness on palpation of the left lateral and anterior ribs approximately seventh, eighth and ninth. Abd:  No distention.  Soft, nontender, bowel sounds normoactive. Other:     ED Results / Procedures / Treatments   Labs (all labs ordered are listed, but only abnormal results are displayed) Labs Reviewed - No data to display    EKG  Vent. rate 79 BPM PR interval 238 ms QRS duration 76 ms QT/QTcB 418/479 ms P-R-T axes 56 58 72 Sinus rhythm with 1st degree A-V block Otherwise normal ECG   RADIOLOGY Left rib x-ray images were reviewed by  myself.  Radiology reports an acute nondisplaced fracture left posterior seventh rib.  No pneumothorax.    PROCEDURES:  Critical Care performed:   Procedures   MEDICATIONS ORDERED IN ED: Medications  lidocaine (LIDODERM) 5 % 1 patch (1 patch Transdermal Patch Applied 03/20/22 1210)     IMPRESSION / MDM / ASSESSMENT AND PLAN / ED COURSE  I reviewed the triage vital signs and the nursing notes.   Differential diagnosis includes, but is not limited to, contusion left ribs, fracture left ribs, mechanical fall, syncopal episode secondary to pain, evaluation of syncopal episode.  86 year old female presents to the ED with complaint of left rib pain since a fall that she took last p.m. hitting her ribs on the left side.  She has continued to have pain this morning.  She states she had a syncopal episode secondary to pain and called EMS.  I discussed the x-ray findings with her acknowledging that she does have 1 rib fracture.  I ordered basic labs which patient refused stating that she was not concerned about her syncopal  episode as it was related to pain.  We discussed pain control and patient states she is comfortable with continuing with her Tylenol that she has at home.  I suggested a Lidoderm patch which was applied to her rib pain prior to discharge.  A prescription for the same was sent to the pharmacy.  She agrees to follow-up with her PCP and is aware that she should take deep breaths every hour to help prevent her from getting pneumonia.     Patient's presentation is most consistent with acute complicated illness / injury requiring diagnostic workup.  FINAL CLINICAL IMPRESSION(S) / ED DIAGNOSES   Final diagnoses:  Fracture of one rib, left side, initial encounter for closed fracture     Rx / DC Orders   ED Discharge Orders          Ordered    lidocaine (LIDODERM) 5 %  Every 12 hours        03/20/22 1217             Note:  This document was prepared using Dragon  voice recognition software and may include unintentional dictation errors.   Johnn Hai, PA-C 03/20/22 1413    Vanessa [place], MD 03/23/22 [phone]

## 2022-03-20 NOTE — ED Notes (Signed)
Labs discontinued per PA Suanne Marker

## 2022-03-22 ENCOUNTER — Encounter: Payer: Self-pay | Admitting: Family

## 2022-03-22 ENCOUNTER — Ambulatory Visit (INDEPENDENT_AMBULATORY_CARE_PROVIDER_SITE_OTHER): Payer: Medicare Other | Admitting: Family

## 2022-03-22 DIAGNOSIS — S2239XA Fracture of one rib, unspecified side, initial encounter for closed fracture: Secondary | ICD-10-CM | POA: Insufficient documentation

## 2022-03-22 DIAGNOSIS — S2232XD Fracture of one rib, left side, subsequent encounter for fracture with routine healing: Secondary | ICD-10-CM | POA: Diagnosis not present

## 2022-03-22 NOTE — Assessment & Plan Note (Signed)
Reviewed ED course and x-ray image with patient today.  Pain is overall controlled with Salonpas pain patch, acetaminophen.  Counseled patient on not to exceed 4 g acetaminophen in 24 hours.  Briefly discussed tramadol which she politely declines at this time.  She is more inclined to try heat and ice.  She will let me know how she is doing.

## 2022-03-22 NOTE — Patient Instructions (Signed)
As discussed, you may continue acetaminophen as you have been taking.  Be careful not to exceed 4 g of acetaminophen in 24 hours.  You may also trial scheduling Tylenol Arthritis which is a '650mg'$  tablet .   You may take 1-2 tablets every 8 hours ( scheduled) with maximum of 6 tablets per day.   For example , you could take two tablets in the morning ( 8am) and then two tablets again at 4pm.   Maximum daily dose of acetaminophen 4 g per day from all sources.  If you are taking another medication which includes acetaminophen (Tylenol) which may be in cough and cold preparations or pain medication such as Percocet, you will need to factor that into your total daily dose to be safe.  Please let me know if any questions  As discussed, alternate heat or ice to provide relief.  Salonpas pain patches as you are doing.  Please let me know if you like to consider tramadol.  Nice to meet you

## 2022-03-22 NOTE — Progress Notes (Signed)
Subjective:    Patient ID: Teila Butenschon Szuch, female    DOB: 1936/05/03, 86 y.o.   MRN: [id number]  CC: Genoveva Butenschon Wierenga is a 86 y.o. female who presents today for follow up.   HPI: Left anterior rib pain, unchanged.   She is taking deep breaths without pain. No cp.   Pain can be intense at time. She slept in recliner last night which was more comfortable.   [place] and opening door while holding plate and glass.   She fell on left posterior back onto rock.  She didn't hit her head or have LOC during fall.    She then went inside and sat in the chair as could feel was going to pass out. She sat in a chair and fainted.       Using salon pas with some relief. She has been taking tylenol '500mg'$  and two '650mg'$  at night. ( '1800mg'$  /day) .   She had a glass of wine.   Seen emergency room 03/20/2022 and diagnosed with left-sided rib fracture. She tripped and fell, had a syncopal episode and called EMS  X-ray shows left seventh rib without significant displacement.  No pneumothorax.  Troponin negative  x 2 No leukocytosis EKG SR with first degree AV block.   H/o HTN, postoperative atrial fibrillation, aortic valve replaced  Follow-up Dr. Ubaldo Glassing 02/16/2022 regarding aortic stenosis.  Planning Holter monitor She is compliant with aspirin 325 mg daily, amlodipine 5 mg twice daily She is no longer on prescription metoprolol  No h/o seizure.    HISTORY:  Past Medical History:  Diagnosis Date   Allergy    Anxiety    Aortic stenosis    Arthritis    Bright red blood per rectum    Carotid bruit    Dyspnea    Heart murmur    Hyperlipidemia    Hypertension    Syncope 2012   Past Surgical History:  Procedure Laterality Date   AORTIC VALVE REPLACEMENT  04/26/2016   BREAST BIOPSY Left 1997   core- neg   CARDIAC CATHETERIZATION Bilateral 02/13/2016   Procedure: Right/Left Heart Cath and Coronary Angiography;  Surgeon:  Teodoro Spray, MD;  Location: [facility];  Service: Cardiovascular;  Laterality: Bilateral;   CARDIAC VALVE REPLACEMENT     CATARACT EXTRACTION  2014   x2   COLONOSCOPY  2006   COLONOSCOPY WITH PROPOFOL N/A 08/25/2016   Procedure: COLONOSCOPY WITH PROPOFOL;  Surgeon: Robert Bellow, MD;  Location: [facility];  Service: Endoscopy;  Laterality: N/A;   COLONOSCOPY WITH PROPOFOL N/A 07/15/2021   Procedure: COLONOSCOPY WITH PROPOFOL;  Surgeon: Robert Bellow, MD;  Location: [facility];  Service: Endoscopy;  Laterality: N/A;   Family History  Problem Relation Age of Onset   Hyperlipidemia Mother    Hypertension Mother    Stroke Mother    Cancer Father        stomach   Early death Father    Stroke Sister    Hypertension Sister    Stroke Sister    Hypertension Sister    Stroke Maternal Grandmother    Stroke Maternal Grandfather    Cancer Paternal Grandfather    Early death Paternal Grandfather    Breast cancer Neg Hx     Allergies: Patient has no known allergies. Current Outpatient Medications on File Prior to Visit  Medication Sig Dispense Refill   amLODipine (NORVASC) 5 MG tablet Take  5 mg by mouth 2 (two) times daily.     AMOXICILLIN PO Take by mouth.     aspirin 325 MG tablet Take 325 mg by mouth daily.     furosemide (LASIX) 20 MG tablet Take 20 mg by mouth as needed.     lidocaine (LIDODERM) 5 % Place 1 patch onto the skin every 12 (twelve) hours. Remove & Discard patch within 12 hours or as directed by MD 10 patch 0   vitamin C (ASCORBIC ACID) 500 MG tablet Take 500 mg by mouth daily.     No current facility-administered medications on file prior to visit.    Social History   Tobacco Use   Smoking status: Never   Smokeless tobacco: Never  Substance Use Topics   Alcohol use: Yes    Alcohol/week: 0.0 standard drinks of alcohol    Comment: 1/day   Drug use: No    Review of Systems  Constitutional:  Negative for chills and fever.  Respiratory:   Negative for cough and shortness of breath.   Cardiovascular:  Negative for chest pain and palpitations.  Gastrointestinal:  Negative for nausea and vomiting.  Musculoskeletal:  Negative for back pain.      Objective:    BP 138/78 (BP Location: Left Arm, Patient Position: Sitting, Cuff Size: Normal)   Pulse 78   Temp 97.8 F (36.6 C) (Oral)   Ht '5\' 5"'$  (1.651 m)   Wt 135 lb (61.2 kg)   SpO2 98%   BMI 22.47 kg/m  BP Readings from Last 3 Encounters:  03/22/22 138/78  03/20/22 (!) 165/97  12/17/21 138/74   Wt Readings from Last 3 Encounters:  03/22/22 135 lb (61.2 kg)  03/20/22 133 lb (60.3 kg)  12/17/21 138 lb 12.8 oz (63 kg)    Physical Exam Vitals reviewed.  Constitutional:      Appearance: She is well-developed.  Eyes:     Conjunctiva/sclera: Conjunctivae normal.  Cardiovascular:     Rate and Rhythm: Normal rate and regular rhythm.     Pulses: Normal pulses.     Heart sounds: Normal heart sounds.  Pulmonary:     Effort: Pulmonary effort is normal.     Breath sounds: Normal breath sounds. No wheezing, rhonchi or rales.       Comments: Small ecchymosis noted left posterior back Chest:       Comments: Diffuse anterior tenderness noted.  No bony step-off, edema or ecchymosis appreciated. Salon pain patch present.  Skin:    General: Skin is warm and dry.  Neurological:     Mental Status: She is alert.  Psychiatric:        Speech: Speech normal.        Behavior: Behavior normal.        Thought Content: Thought content normal.        Assessment & Plan:   Problem List Items Addressed This Visit       Musculoskeletal and Integument   Rib fracture    Reviewed ED course and x-ray image with patient today.  Pain is overall controlled with Salonpas pain patch, acetaminophen.  Counseled patient on not to exceed 4 g acetaminophen in 24 hours.  Briefly discussed tramadol which she politely declines at this time.  She is more inclined to try heat and ice.  She will  let me know how she is doing.        I have discontinued Nori B. Dobberstein's metoprolol succinate and rosuvastatin. I am also having  her maintain her aspirin, ascorbic acid, furosemide, AMOXICILLIN PO, amLODipine, and lidocaine.   No orders of the defined types were placed in this encounter.   Return precautions given.   Risks, benefits, and alternatives of the medications and treatment plan prescribed today were discussed, and patient expressed understanding.   Education regarding symptom management and diagnosis given to patient on AVS.  Continue to follow with Crecencio Mc, MD for routine health maintenance.   Zareen Butenschon Bonczek and I agreed with plan.   Mable Paris, FNP

## 2022-03-23 ENCOUNTER — Telehealth: Payer: Self-pay

## 2022-03-23 DIAGNOSIS — S2232XD Fracture of one rib, left side, subsequent encounter for fracture with routine healing: Secondary | ICD-10-CM

## 2022-03-23 MED ORDER — TRAMADOL HCL 50 MG PO TABS: 50.0000 mg | ORAL_TABLET | Freq: Two times a day (BID) | ORAL | 0 refills | Status: AC | PRN

## 2022-03-23 NOTE — Telephone Encounter (Signed)
Call pt  Absolutely.   We discussed tramadol yesterday. I have sent in small supply.   Please reiterate that this is a controlled substance and opioid like medication.  Please advise her not to take this medication with alcohol or more certainly another opioid.  It can be sedating.  Please asked patient to call me if she is not feeling better with conservative therapy  I looked up patient on [place] Controlled Substances Reporting System PMP AWARE and saw no activity that raised concern of inappropriate use.

## 2022-03-23 NOTE — Telephone Encounter (Signed)
Patient states she saw Mable Paris, FNP, yesterday for a fractured rib.  Patient states she declined Mable Paris, FNP's offer for tramadol, but she has changed her mind.  Patient states she would like to get a good night's sleep.   Patient states she would like for [place] to send the prescription to [facility] on [address].

## 2022-03-23 NOTE — Telephone Encounter (Signed)
Spoke to patient and she had already had someone pick up medication for her.

## 2022-03-26 ENCOUNTER — Telehealth: Payer: Self-pay

## 2022-03-26 NOTE — Telephone Encounter (Signed)
        Patient  visited [place] on 10/14    Telephone encounter attempt :  1st  A HIPAA compliant voice message was left requesting a return call.  Instructed patient to call back    [place], [place] [facility]  [phone] [address], [place], [place] [address] Phone: [phone] Email: [email or web address]

## 2022-07-26 ENCOUNTER — Ambulatory Visit (INDEPENDENT_AMBULATORY_CARE_PROVIDER_SITE_OTHER): Payer: Medicare Other

## 2022-07-26 VITALS — Ht 65.0 in | Wt 135.0 lb

## 2022-07-26 DIAGNOSIS — Z0000 Encounter for general adult medical examination without abnormal findings: Secondary | ICD-10-CM | POA: Diagnosis not present

## 2022-07-26 NOTE — Progress Notes (Cosign Needed Addendum)
Subjective:   Sheila Osborne is a 87 y.o. female who presents for Medicare Annual (Subsequent) preventive examination.  Review of Systems    No ROS.  Medicare Wellness Virtual Visit.  Visual/audio telehealth visit, UTA vital signs.   See social history for additional risk factors.   Cardiac Risk Factors include: advanced age (>64mn, >>72women)     Objective:    Today's Vitals   07/26/22 1236  Weight: 135 lb (61.2 kg)  Height: '5\' 5"'$  (1.651 m)   Body mass index is 22.47 kg/m.     07/26/2022   12:41 PM 03/20/2022   11:22 AM 07/16/2021    1:28 PM 07/15/2021    7:56 AM 08/25/2016    8:17 AM 06/21/2016   11:24 AM 12/20/2015    3:50 PM  Advanced Directives  Does Patient Have a Medical Advance Directive? Yes No Yes Yes No No No  Type of AParamedicof [place]ams[place]Living will  [place] will [place] will     Does patient want to make changes to medical advance directive? No - Patient declined  No - Patient declined      Copy of [place] Chart? Yes - validated most recent copy scanned in chart (See row information)  Yes - validated most recent copy scanned in chart (See row information)      Would patient like information on creating a medical advance directive?  No - Patient declined    Yes (MAU/Ambulatory/Procedural Areas - Information given)     Current Medications (verified) Outpatient Encounter Medications as of 07/26/2022  Medication Sig   amLODipine (NORVASC) 5 MG tablet Take 5 mg by mouth 2 (two) times daily.   AMOXICILLIN PO Take by mouth.   aspirin 325 MG tablet Take 325 mg by mouth daily.   furosemide (LASIX) 20 MG tablet Take 20 mg by mouth as needed.   lidocaine (LIDODERM) 5 % Place 1 patch onto the skin every 12 (twelve) hours. Remove & Discard patch within 12 hours or as directed by MD   vitamin C (ASCORBIC ACID) 500 MG tablet Take 500 mg by mouth daily.   No  facility-administered encounter medications on file as of 07/26/2022.    Allergies (verified) Patient has no known allergies.   History: Past Medical History:  Diagnosis Date   Allergy    Anxiety    Aortic stenosis    Arthritis    Bright red blood per rectum    Carotid bruit    Dyspnea    Heart murmur    Hyperlipidemia    Hypertension    Syncope 2012   Past Surgical History:  Procedure Laterality Date   AORTIC VALVE REPLACEMENT  04/26/2016   BREAST BIOPSY Left 1997   core- neg   CARDIAC CATHETERIZATION Bilateral 02/13/2016   Procedure: Right/Left Heart Cath and Coronary Angiography;  Surgeon: KTeodoro Spray MD;  [facility];  Service: Cardiovascular;  Laterality: Bilateral;   CARDIAC VALVE REPLACEMENT     CATARACT EXTRACTION  2014   x2   COLONOSCOPY  2006   COLONOSCOPY WITH PROPOFOL N/A 08/25/2016   Procedure: COLONOSCOPY WITH PROPOFOL;  Surgeon: JRobert Bellow MD;  Location: [facility];  Service: Endoscopy;  Laterality: N/A;   COLONOSCOPY WITH PROPOFOL N/A 07/15/2021   Procedure: COLONOSCOPY WITH PROPOFOL;  Surgeon: BRobert Bellow MD;  Location: [facility];  Service: Endoscopy;  Laterality: N/A;   Family History  Problem Relation Age of Onset   Hyperlipidemia Mother    Hypertension Mother    Stroke Mother    Cancer Father        stomach   Early death Father    Stroke Sister    Hypertension Sister    Stroke Sister    Hypertension Sister    Stroke Maternal Grandmother    Stroke Maternal Grandfather    Cancer Paternal Grandfather    Early death Paternal Grandfather    Breast cancer Neg Hx    Social History   Socioeconomic History   Marital status: Widowed    Spouse name: Not on file   Number of children: Not on file   Years of education: Not on file   Highest education level: Not on file  Occupational History   Not on file  Tobacco Use   Smoking status: Never   Smokeless tobacco: Never  Substance and Sexual Activity    Alcohol use: Yes    Alcohol/week: 0.0 standard drinks of alcohol    Comment: 1/day   Drug use: No   Sexual activity: Not Currently  Other Topics Concern   Not on file  Social History Narrative   Not on file   Social Determinants of Health   Financial Resource Strain: Low Risk  (07/25/2022)   Overall Financial Resource Strain (CARDIA)    Difficulty of Paying Living Expenses: Not hard at all  Food Insecurity: No Food Insecurity (07/25/2022)   Hunger Vital Sign    Worried About Running Out of Food in the Last Year: Never true    Ran Out of Food in the Last Year: Never true  Transportation Needs: No Transportation Needs (07/25/2022)   PRAPARE - Hydrologist (Medical): No    Lack of Transportation (Non-Medical): No  Physical Activity: Sufficiently Active (07/25/2022)   Exercise Vital Sign    Days of Exercise per Week: 6 days    Minutes of Exercise per Session: 40 min  Stress: No Stress Concern Present (07/25/2022)   [place]    Feeling of Stress : Not at all  Social Connections: Unknown (07/25/2022)   Social Connection and Isolation Panel [NHANES]    Frequency of Communication with Friends and Family: More than three times a week    Frequency of Social Gatherings with Friends and Family: More than three times a week    Attends Religious Services: Not on file    Active Member of Clubs or Organizations: Yes    Attends Archivist Meetings: More than 4 times per year    Marital Status: Widowed    Tobacco Counseling Counseling given: Not Answered   Clinical Intake:  Pre-visit preparation completed: Yes        Diabetes: No  How often do you need to have someone help you when you read instructions, pamphlets, or other written materials from your doctor or pharmacy?: 1 - Never   Interpreter Needed?: No      Activities of Daily Living    07/25/2022    1:48 PM  In your  present state of health, do you have any difficulty performing the following activities:  Hearing? 0  Vision? 0  Difficulty concentrating or making decisions? 0  Walking or climbing stairs? 0  Dressing or bathing? 0  Doing errands, shopping? 0  Preparing Food and eating ? N  Using the Toilet? N  In the past six months, have you  accidently leaked urine? N  Do you have problems with loss of bowel control? N  Managing your Medications? N  Managing your Finances? N  Housekeeping or managing your Housekeeping? N    Patient Care Team: Crecencio Mc, MD as PCP - General (Internal Medicine) Bary Castilla [name], MD (General Surgery) Lenard Simmer, MD as Attending Physician (Endocrinology)  Indicate any recent Medical Services you may have received from other than [facility] providers in the past year (date may be approximate).     Assessment:   This is a routine wellness examination for [place].  I connected with  Sheila Osborne on 07/26/22 by a audio enabled telemedicine application and verified that I am speaking with the correct person using two identifiers.  Patient Location: Home  Provider Location: Office/Clinic  I discussed the limitations of evaluation and management by telemedicine. The patient expressed understanding and agreed to proceed.   Patient Medicare AWV questionnaire was completed by the patient on 07/25/22 , I have confirmed that all information answered by patient is correct and no changes since this date.   Hearing/Vision screen Hearing Screening - Comments:: Hearing aids Vision Screening - Comments:: Followed by [facility] Wears corrective lenses. Bilateral cataract extraction.    Dietary issues and exercise activities discussed: Current Exercise Habits: Home exercise routine, Time (Minutes): 40, Frequency (Times/Week): 6, Weekly Exercise (Minutes/Week): 240, Intensity: Mild   Goals Addressed               This Visit's Progress      Patient Stated     I am working at not feeling any older (pt-stated)        Stay active. Healthy diet. Stay in touch with friends/family. Continue painting.  Face time with siblings daily (laughing, singing, poetry etc..)        Depression Screen    07/26/2022   12:42 PM 12/17/2021    9:13 AM 07/16/2021    1:27 PM 12/24/2020   10:07 AM 07/17/2020   12:02 PM 07/15/2020    1:21 PM 08/28/2019    1:39 PM  PHQ 2/9 Scores  PHQ - 2 Score 0 0 0 0 0 0 1    Fall Risk    07/25/2022    1:48 PM 12/17/2021    9:13 AM 07/16/2021    1:28 PM 12/24/2020   10:07 AM 12/17/2020    9:56 AM  Fall Risk   Falls in the past year? 1 0 0 1 1  Number falls in past yr: 0   0 0  Injury with Fall? '1   1 1  '$ Risk for fall due to :  No Fall Risks  History of fall(s)   Follow up  Falls evaluation completed Falls evaluation completed Falls evaluation completed Falls evaluation completed    [place]: Home free of loose throw rugs in walkways, pet beds, electrical cords, etc? Yes  Adequate lighting in your home to reduce risk of falls? Yes   ASSISTIVE DEVICES UTILIZED TO PREVENT FALLS: Phone alert/Life alert? Yes  Use of a cane, walker or w/c? No  Grab bars in the bathroom? Yes  Shower chair or bench in shower? Yes  Elevated toilet seat or a handicapped toilet? No   TIMED UP AND GO: Was the test performed? No .   Cognitive Function:        07/26/2022   12:50 PM  6CIT Screen  What Year? 0 points  What month? 0  points  What time? 0 points  Count back from 20 0 points  Months in reverse 0 points  Repeat phrase 0 points  Total Score 0 points    Immunizations Immunization History  Administered Date(s) Administered   Fluad Quad(high Dose 65+) 05/21/2021   Influenza-Unspecified 03/27/2019, 03/20/2020   PFIZER(Purple Top)SARS-COV-2 Vaccination 06/21/2019, 07/12/2019, 03/17/2020, 10/13/2020   Pneumococcal Conjugate-13 07/17/2020   Zoster Recombinat (Shingrix)  07/28/2021, 10/25/2021   TDAP status: Due, Education has been provided regarding the importance of this vaccine. Advised may receive this vaccine at local pharmacy or Health Dept. Aware to provide a copy of the vaccination record if obtained from local pharmacy or Health Dept. Verbalized acceptance and understanding.  Pneumococcal vaccine status: Due, Education has been provided regarding the importance of this vaccine. Advised may receive this vaccine at local pharmacy or Health Dept. Aware to provide a copy of the vaccination record if obtained from local pharmacy or Health Dept. Verbalized acceptance and understanding.  Covid-19 vaccine status: Completed vaccines  Screening Tests Health Maintenance  Topic Date Due   DTaP/Tdap/Td (1 - Tdap) Never done   COVID-19 Vaccine (5 - 2023-24 season) 08/11/2022 (Originally 02/05/2022)   INFLUENZA VACCINE  09/05/2022 (Originally 01/05/2022)   Pneumonia Vaccine 24+ Years old (2 of 2 - PPSV23 or PCV20) 12/06/2022 (Originally 07/17/2021)   MAMMOGRAM  07/28/2022   Medicare Annual Wellness (AWV)  07/27/2023   DEXA SCAN  Completed   Zoster Vaccines- Shingrix  Completed   HPV VACCINES  Aged Out    Health Maintenance Health Maintenance Due  Topic Date Due   DTaP/Tdap/Td (1 - Tdap) Never done   Lung Cancer Screening: (Low Dose CT Chest recommended if Age 57-80 years, 30 pack-year currently smoking OR have quit w/in 15years.) does not qualify.   Hepatitis C Screening: does not qualify.  Vision Screening: Recommended annual ophthalmology exams for early detection of glaucoma and other disorders of the eye.  Dental Screening: Recommended annual dental exams for proper oral hygiene.  Community Resource Referral / Chronic Care Management: CRR required this visit?  No   CCM required this visit?  No      Plan:     I have personally reviewed and noted the following in the patient's chart:   Medical and social history Use of alcohol, tobacco or  illicit drugs  Current medications and supplements including opioid prescriptions. Patient is not currently taking opioid prescriptions. Functional ability and status Nutritional status Physical activity Advanced directives List of other physicians Hospitalizations, surgeries, and ER visits in previous 12 months Vitals Screenings to include cognitive, depression, and falls Referrals and appointments  In addition, I have reviewed and discussed with patient certain preventive protocols, quality metrics, and best practice recommendations. A written personalized care plan for preventive services as well as general preventive health recommendations were provided to patient.     [place], LPN   [id number]     I have reviewed the above information and agree with above.   Deborra Medina, MD

## 2022-07-26 NOTE — Patient Instructions (Addendum)
[name] , Thank you for taking time to come for your Medicare Wellness Visit. I appreciate your ongoing commitment to your health goals. Please review the following plan we discussed and let me know if I can assist you in the future.   These are the goals we discussed:  Goals       Patient Stated     I am working at not feeling any older (pt-stated)      Stay active. Healthy diet. Stay in touch with friends/family. Continue painting.  Face time with siblings daily (laughing, singing, poetry etc..)         This is a list of the screening recommended for you and due dates:  Health Maintenance  Topic Date Due   DTaP/Tdap/Td vaccine (1 - Tdap) Never done   COVID-19 Vaccine (5 - 2023-24 season) 08/11/2022*   Flu Shot  09/05/2022*   Pneumonia Vaccine (2 of 2 - PPSV23 or PCV20) 12/06/2022*   Mammogram  07/28/2022   Medicare Annual Wellness Visit  07/27/2023   DEXA scan (bone density measurement)  Completed   Zoster (Shingles) Vaccine  Completed   HPV Vaccine  Aged Out  *Topic was postponed. The date shown is not the original due date.    Advanced directives: on file  Conditions/risks identified: none new.  Next appointment: Follow up in one year for your annual wellness visit    Preventive Care 65 Years and Older, Female Preventive care refers to lifestyle choices and visits with your health care provider that can promote health and wellness. What does preventive care include? A yearly physical exam. This is also called an annual well check. Dental exams once or twice a year. Routine eye exams. Ask your health care provider how often you should have your eyes checked. Personal lifestyle choices, including: Daily care of your teeth and gums. Regular physical activity. Eating a healthy diet. Avoiding tobacco and drug use. Limiting alcohol use. Practicing safe sex. Taking low-dose aspirin every day. Taking vitamin and mineral supplements as recommended by your health  care provider. What happens during an annual well check? The services and screenings done by your health care provider during your annual well check will depend on your age, overall health, lifestyle risk factors, and family history of disease. Counseling  Your health care provider may ask you questions about your: Alcohol use. Tobacco use. Drug use. Emotional well-being. Home and relationship well-being. Sexual activity. Eating habits. History of falls. Memory and ability to understand (cognition). Work and work Statistician. Reproductive health. Screening  You may have the following tests or measurements: Height, weight, and BMI. Blood pressure. Lipid and cholesterol levels. These may be checked every 5 years, or more frequently if you are over 5 years old. Skin check. Lung cancer screening. You may have this screening every year starting at age 71 if you have a 30-pack-year history of smoking and currently smoke or have quit within the past 15 years. Fecal occult blood test (FOBT) of the stool. You may have this test every year starting at age 63. Flexible sigmoidoscopy or colonoscopy. You may have a sigmoidoscopy every 5 years or a colonoscopy every 10 years starting at age 37. Hepatitis C blood test. Hepatitis B blood test. Sexually transmitted disease (STD) testing. Diabetes screening. This is done by checking your blood sugar (glucose) after you have not eaten for a while (fasting). You may have this done every 1-3 years. Bone density scan. This is done to screen for osteoporosis. You may  have this done starting at age 21. Mammogram. This may be done every 1-2 years. Talk to your health care provider about how often you should have regular mammograms. Talk with your health care provider about your test results, treatment options, and if necessary, the need for more tests. Vaccines  Your health care provider may recommend certain vaccines, such as: Influenza vaccine. This is  recommended every year. Tetanus, diphtheria, and acellular pertussis (Tdap, Td) vaccine. You may need a Td booster every 10 years. Zoster vaccine. You may need this after age 75. Pneumococcal 13-valent conjugate (PCV13) vaccine. One dose is recommended after age 5. Pneumococcal polysaccharide (PPSV23) vaccine. One dose is recommended after age 11. Talk to your health care provider about which screenings and vaccines you need and how often you need them. This information is not intended to replace advice given to you by your health care provider. Make sure you discuss any questions you have with your health care provider. Document Released: 06/20/2015 Document Revised: 02/11/2016 Document Reviewed: 03/25/2015 Elsevier Interactive Patient Education  2017 [place] Prevention in the Home Falls can cause injuries. They can happen to people of all ages. There are many things you can do to make your home safe and to help prevent falls. What can I do on the outside of my home? Regularly fix the edges of walkways and driveways and fix any cracks. Remove anything that might make you trip as you walk through a door, such as a raised step or threshold. Trim any bushes or trees on the path to your home. Use bright outdoor lighting. Clear any walking paths of anything that might make someone trip, such as rocks or tools. Regularly check to see if handrails are loose or broken. Make sure that both sides of any steps have handrails. Any raised decks and porches should have guardrails on the edges. Have any leaves, snow, or ice cleared regularly. Use sand or salt on walking paths during winter. Clean up any spills in your garage right away. This includes oil or grease spills. What can I do in the bathroom? Use night lights. Install grab bars by the toilet and in the tub and shower. Do not use towel bars as grab bars. Use non-skid mats or decals in the tub or shower. If you need to sit down in  the shower, use a plastic, non-slip stool. Keep the floor dry. Clean up any water that spills on the floor as soon as it happens. Remove soap buildup in the tub or shower regularly. Attach bath mats securely with double-sided non-slip rug tape. Do not have throw rugs and other things on the floor that can make you trip. What can I do in the bedroom? Use night lights. Make sure that you have a light by your bed that is easy to reach. Do not use any sheets or blankets that are too big for your bed. They should not hang down onto the floor. Have a firm chair that has side arms. You can use this for support while you get dressed. Do not have throw rugs and other things on the floor that can make you trip. What can I do in the kitchen? Clean up any spills right away. Avoid walking on wet floors. Keep items that you use a lot in easy-to-reach places. If you need to reach something above you, use a strong step stool that has a grab bar. Keep electrical cords out of the way. Do not use floor polish  or wax that makes floors slippery. If you must use wax, use non-skid floor wax. Do not have throw rugs and other things on the floor that can make you trip. What can I do with my stairs? Do not leave any items on the stairs. Make sure that there are handrails on both sides of the stairs and use them. Fix handrails that are broken or loose. Make sure that handrails are as long as the stairways. Check any carpeting to make sure that it is firmly attached to the stairs. Fix any carpet that is loose or worn. Avoid having throw rugs at the top or bottom of the stairs. If you do have throw rugs, attach them to the floor with carpet tape. Make sure that you have a light switch at the top of the stairs and the bottom of the stairs. If you do not have them, ask someone to add them for you. What else can I do to help prevent falls? Wear shoes that: Do not have high heels. Have rubber bottoms. Are comfortable  and fit you well. Are closed at the toe. Do not wear sandals. If you use a stepladder: Make sure that it is fully opened. Do not climb a closed stepladder. Make sure that both sides of the stepladder are locked into place. Ask someone to hold it for you, if possible. Clearly mark and make sure that you can see: Any grab bars or handrails. First and last steps. Where the edge of each step is. Use tools that help you move around (mobility aids) if they are needed. These include: Canes. Walkers. Scooters. Crutches. Turn on the lights when you go into a dark area. Replace any light bulbs as soon as they burn out. Set up your furniture so you have a clear path. Avoid moving your furniture around. If any of your floors are uneven, fix them. If there are any pets around you, be aware of where they are. Review your medicines with your doctor. Some medicines can make you feel dizzy. This can increase your chance of falling. Ask your doctor what other things that you can do to help prevent falls. This information is not intended to replace advice given to you by your health care provider. Make sure you discuss any questions you have with your health care provider. Document Released: 03/20/2009 Document Revised: 10/30/2015 Document Reviewed: 06/28/2014 Elsevier Interactive Patient Education  2017 [employer].

## 2022-08-05 IMAGING — CT CT HEAD W/O CM
3 of 4 series · 13 of 47 positions shown, 15 images · non-contrast
Comparison: None.

CLINICAL DATA: Head trauma.

EXAM:
CT HEAD WITHOUT CONTRAST
TECHNIQUE: Contiguous axial images were obtained from the base of the skull
through the vertex without intravenous contrast.

[Series 2: axial st head 5.00 ax · axial · 0.33mm/px · z∈[-621,-516]mm · 7 of 29 slices shown, 9 images]
[im 4/29  brain]
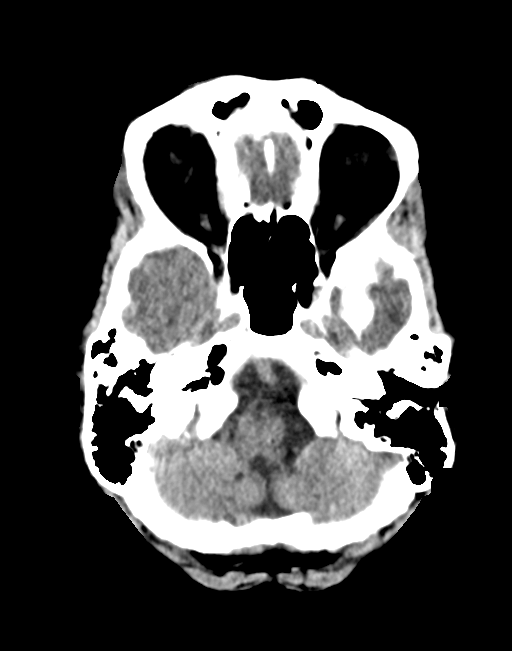
[im 4/29  bone]
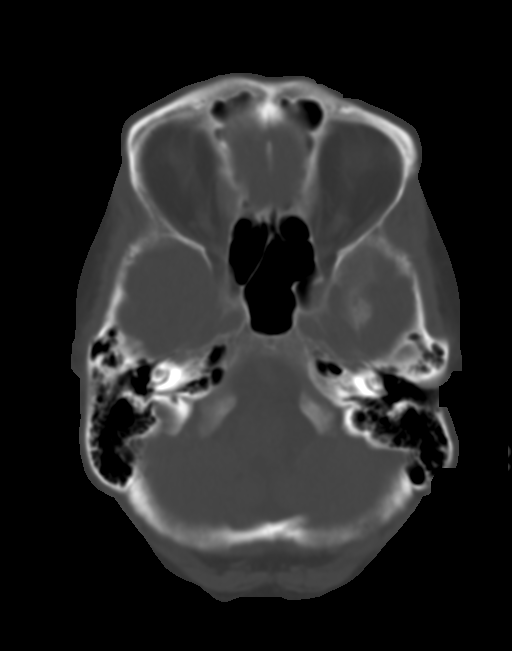
[im 8/29  brain]
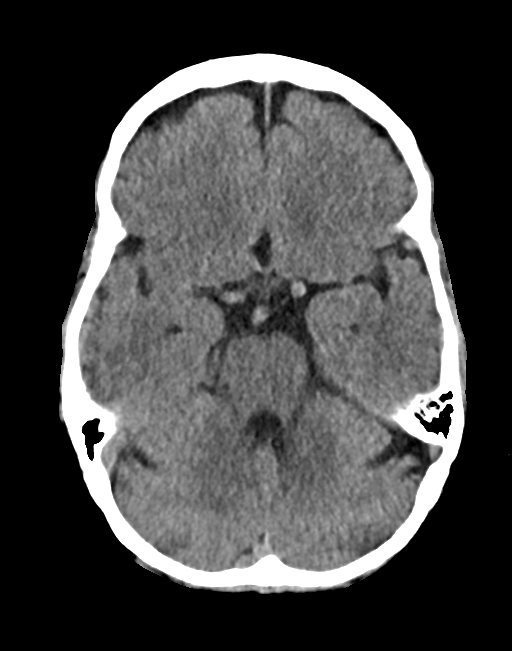
[im 11/29  brain]
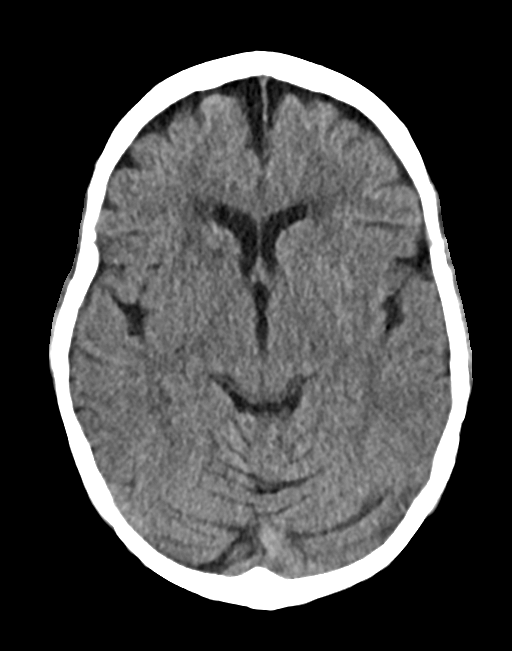
[im 15/29  brain]
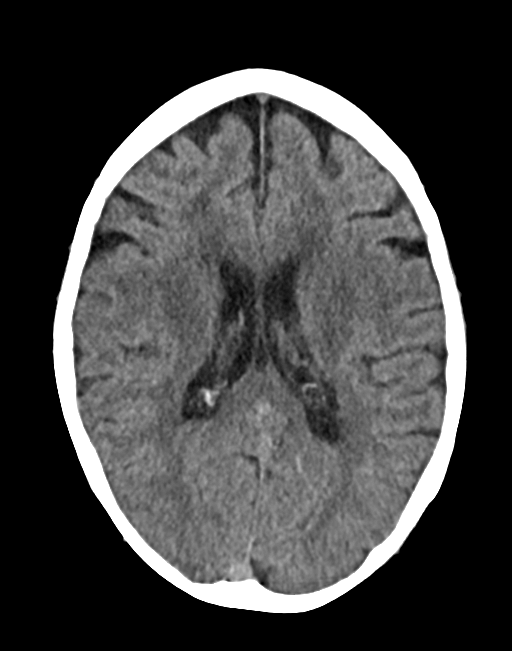
[im 18/29  brain]
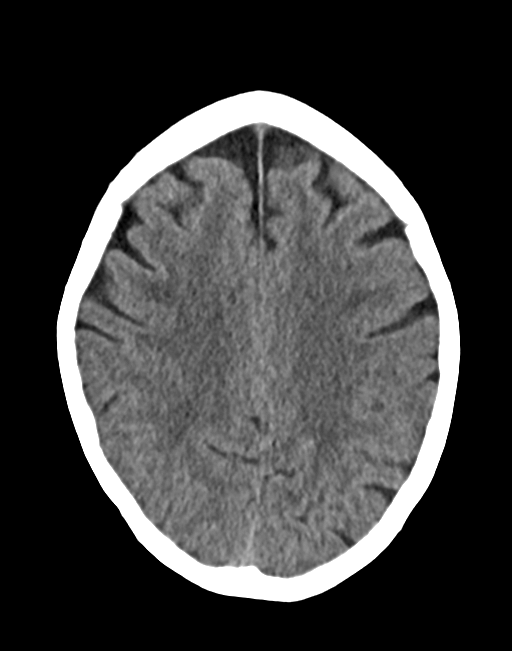
[im 18/29  bone]
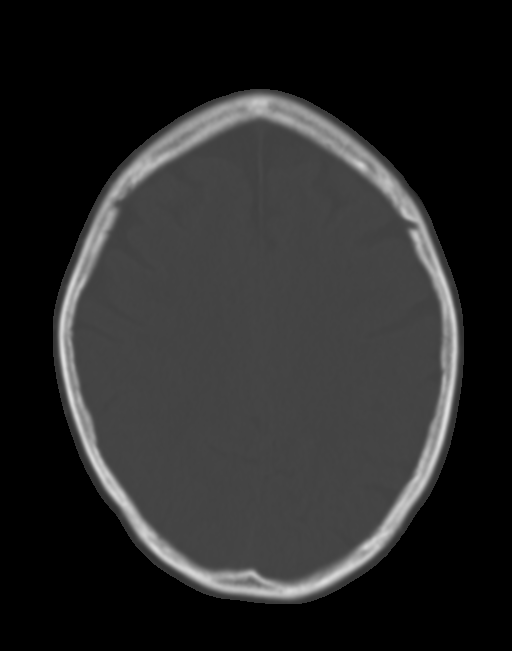
[im 22/29  brain]
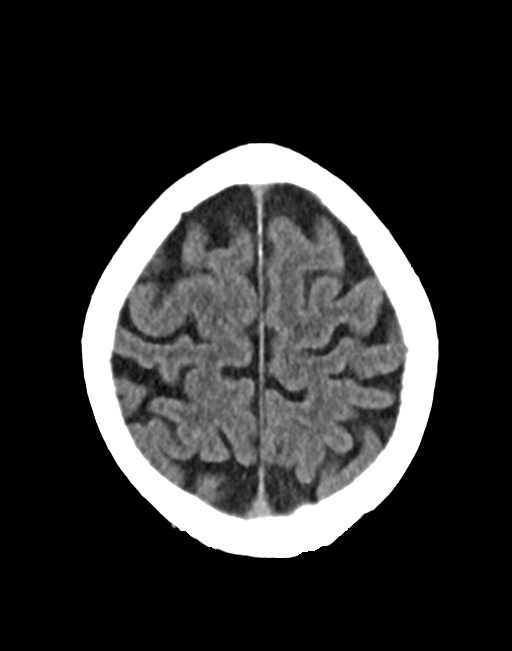
[im 25/29  brain]
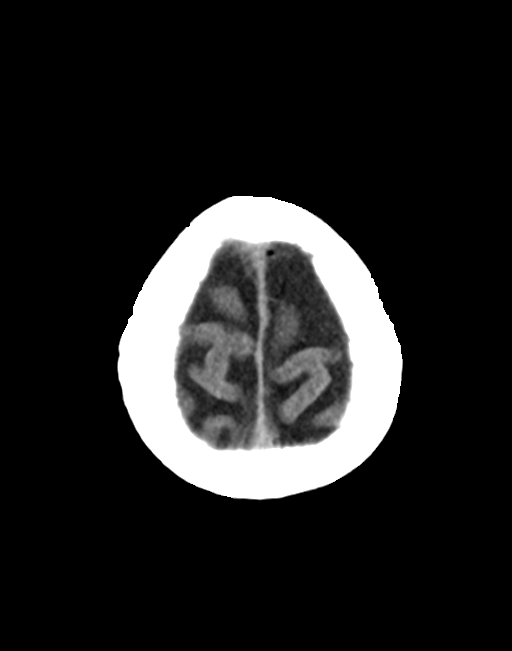

[Series 6: coronals head 3.00 cor · coronal · 0.29mm/px · 3 of 72 slices shown]
[im 24/72  brain]
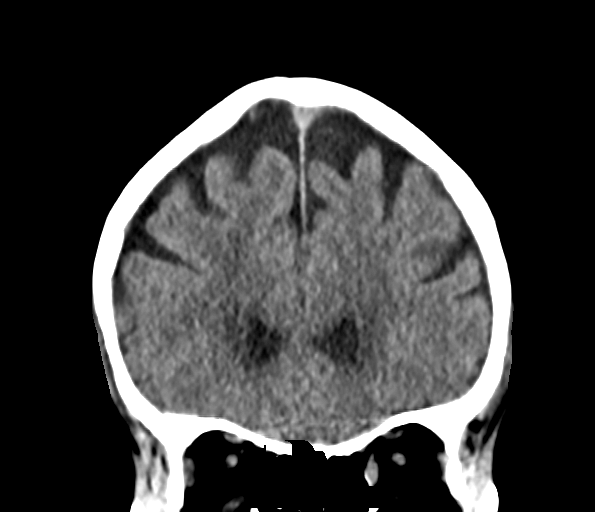
[im 32/72  brain]
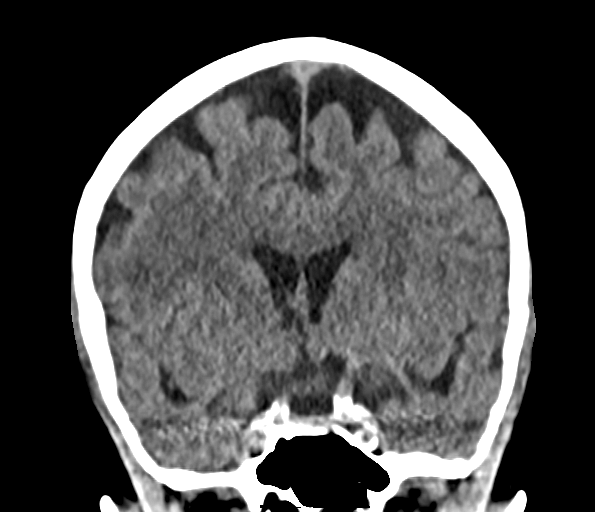
[im 40/72  brain]
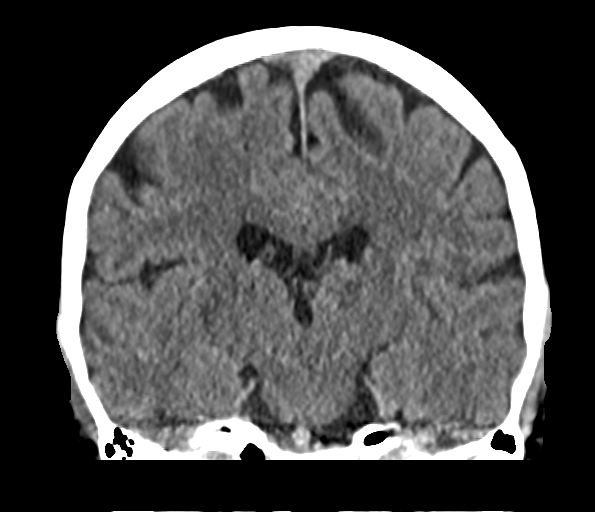

[Series 8: sagittals head 3.00 sag · sagittal · 0.29mm/px · 3 of 57 slices shown]
[im 19/57  brain]
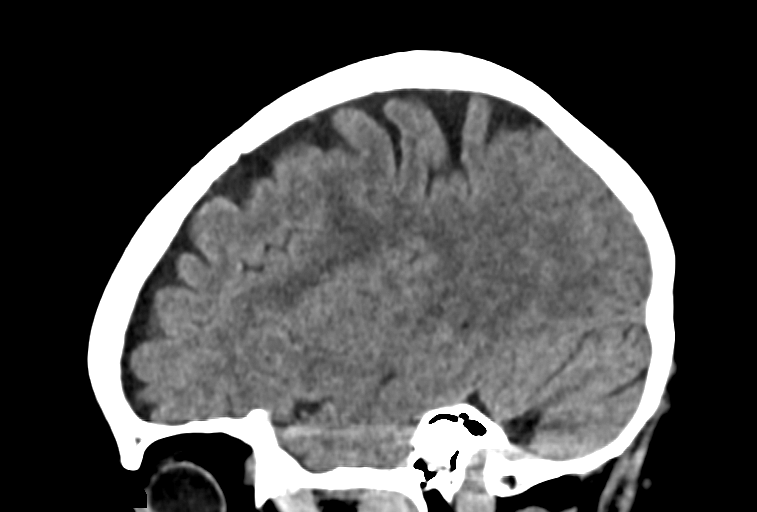
[im 29/57  brain]
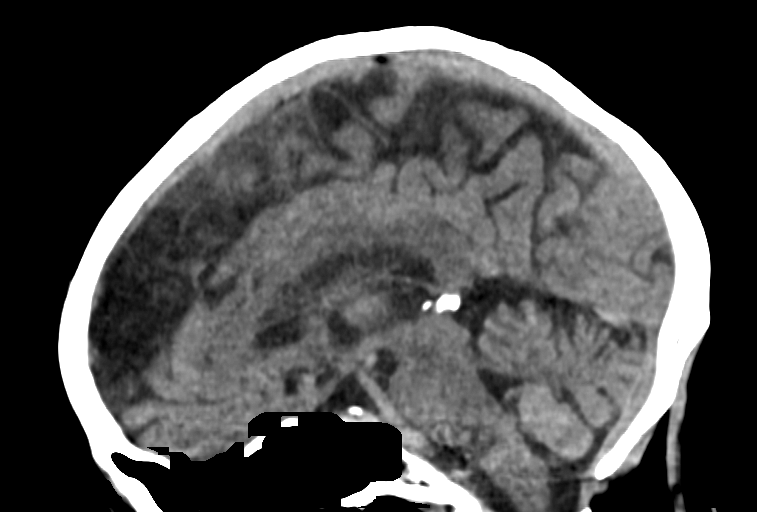
[im 38/57  brain]
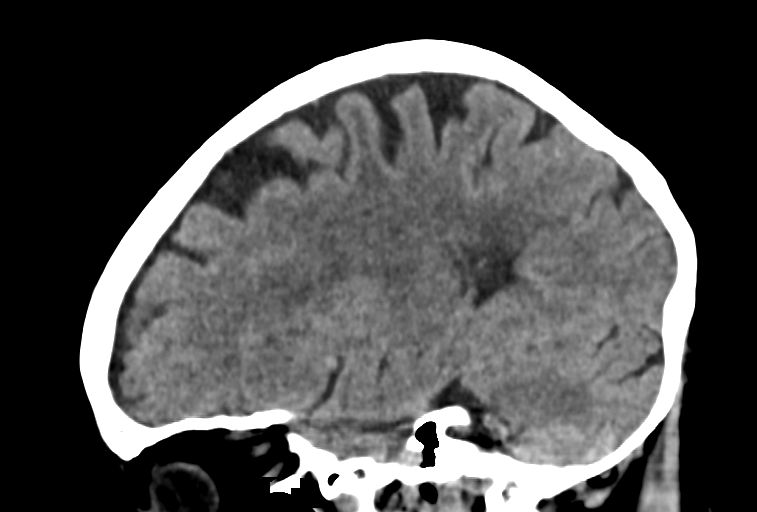

[13 of 47 positions shown; findings below may reference images not displayed]

FINDINGS: Brain: Hypodensity in the right caudate and adjacent white matter.
Additional more ill-defined hypodensity in the left thalamocapsular
region. Additional moderate hypodensities within the white matter,
most likely related to chronic microvascular ischemic disease. No
acute hemorrhage.

Vascular: Calcific atherosclerosis. No hyperdense vessel identified.

Skull: No acute fracture.

Sinuses/Orbits: Polyps versus retention cyst in the left sphenoid
sinus with mild scattered ethmoid air cell mucosal thickening.

Other: No mastoid effusions
IMPRESSION: 1. No evidence of acute traumatic abnormality.
2. Possible age indeterminate left thalamocapsular infarct versus
deep gray chronic microvascular ischemic change. If there is concern
for a recent infarct, an MRI could provide more sensitive
evaluation.
3. Remote appearing right caudate/adjacent white matter infarct.
4. Moderate chronic microvascular ischemic disease.

These results will be called to the ordering clinician or
representative by the Radiologist Assistant, and communication
documented in the PACS or [REDACTED].

## 2022-12-20 ENCOUNTER — Ambulatory Visit (INDEPENDENT_AMBULATORY_CARE_PROVIDER_SITE_OTHER): Payer: Medicare Other | Admitting: Internal Medicine

## 2022-12-20 ENCOUNTER — Encounter: Payer: Self-pay | Admitting: Internal Medicine

## 2022-12-20 ENCOUNTER — Telehealth: Payer: Self-pay | Admitting: Internal Medicine

## 2022-12-20 VITALS — BP 126/78 | HR 79 | Temp 98.2°F | Ht 65.0 in | Wt 138.0 lb

## 2022-12-20 DIAGNOSIS — I700 Atherosclerosis of aorta: Secondary | ICD-10-CM | POA: Insufficient documentation

## 2022-12-20 DIAGNOSIS — E785 Hyperlipidemia, unspecified: Secondary | ICD-10-CM | POA: Diagnosis not present

## 2022-12-20 DIAGNOSIS — Z1231 Encounter for screening mammogram for malignant neoplasm of breast: Secondary | ICD-10-CM

## 2022-12-20 DIAGNOSIS — I10 Essential (primary) hypertension: Secondary | ICD-10-CM | POA: Diagnosis not present

## 2022-12-20 DIAGNOSIS — M791 Myalgia, unspecified site: Secondary | ICD-10-CM

## 2022-12-20 DIAGNOSIS — R5383 Other fatigue: Secondary | ICD-10-CM | POA: Diagnosis not present

## 2022-12-20 DIAGNOSIS — T466X5A Adverse effect of antihyperlipidemic and antiarteriosclerotic drugs, initial encounter: Secondary | ICD-10-CM

## 2022-12-20 DIAGNOSIS — Z1239 Encounter for other screening for malignant neoplasm of breast: Secondary | ICD-10-CM

## 2022-12-20 DIAGNOSIS — R7301 Impaired fasting glucose: Secondary | ICD-10-CM

## 2022-12-20 LAB — MICROALBUMIN / CREATININE URINE RATIO
Creatinine,U: 36.1 mg/dL
Microalb Creat Ratio: 1.9 mg/g (ref 0.0–30.0)
Microalb, Ur: <0.7 mg/dL (ref 0.0–1.9)

## 2022-12-20 NOTE — Telephone Encounter (Signed)
noted 

## 2022-12-20 NOTE — Assessment & Plan Note (Signed)
Noted on recent chest film.  Discussed  this indication for statin therapy g: she is statin intolerant due to recurrent myalgias and headaches. .  She would like quantification of the degree of AA.  Advised to request coronary calcium CT form her cardiologist at her visit in August

## 2022-12-20 NOTE — Patient Instructions (Addendum)
ASK YOUR CARDIOLOGIST  IN AUGUST TO OBTAIN A CORONARY CALCIUM CT SCAN  TO QUANTIFY THE AMOUNT OF  ATHEROSCLEROSIS YOUR ARTERIES AND AORTA HAVE   Your annual mammogram has been ordered AND IS DUE .    Sheila Osborne will not allow [place] to schedule it for you,  so please  call to make your appointment [phone]  Try substituting Benefiber instead of the fiber pills It contains prebiotics.  Goal 60 ounces water daily  Try drinking 16 ounces of water in the morning   [email or web address] If interested in joining the study ,  contact Sheila Osborne at [facility]    I highly recommend reading "the End of Alzheimer's: The First Program to Prevent and Reverse Cognitive Decline"  by Sheila Lamas, MD

## 2022-12-20 NOTE — Assessment & Plan Note (Signed)
She again attempted weekly yuse of stATIN WHICH CAUSED RECURRENT MYALGIAS AND HEADACHE

## 2022-12-20 NOTE — Assessment & Plan Note (Signed)
Well controlled on current regimen of amlodipine and metoprolol. HOme readings have been checked regularly and have been < 130/80.  Most recent 126/78 last week Renal function is ude

## 2022-12-20 NOTE — Telephone Encounter (Signed)
During check out today, as per provider request, she would like to see patient in 4 weeks, however, theres no available openings for me to put pt in.? Please advice.

## 2022-12-20 NOTE — Progress Notes (Signed)
Patient ID: Sheila Osborne, female    DOB: Dec 31, 1935  Age: 87 y.o. MRN: [id number]  The patient is here for  follow up and  management of other chronic and acute problems.   The risk factors are reflected in the social history.  The roster of all physicians providing medical care to patient - is listed in the Snapshot section of the chart.  Activities of daily living:  The patient is 100% independent in all ADLs: dressing, toileting, feeding as well as independent mobility  Home safety : The patient has smoke detectors in the home. They wear seatbelts.  There are no firearms at home. There is no violence in the home.   There is no risks for hepatitis, STDs or HIV. There is no   history of blood transfusion. They have no travel history to infectious disease endemic areas of the world.  The patient has seen their dentist in the last six month. They have seen their eye doctor in the last year. They admit to slight hearing difficulty with regard to whispered voices and some television programs.  They have deferred audiologic testing in the last year.  They do not  have excessive sun exposure. Discussed the need for sun protection: hats, long sleeves and use of sunscreen if there is significant sun exposure.   Diet: the importance of a healthy diet is discussed. She has  a healthy diet.  The benefits of regular aerobic exercise were discussed. She exercises  5 days per week for 30 to 45 minutes using a variety of programs    Depression screen: there are no signs or vegative symptoms of depression- irritability, change in appetite, anhedonia, sadness/tearfullness.  Cognitive assessment: the patient manages all their financial and personal affairs and is actively engaged. They could relate day,date,year and events; recalled 2/3 objects at 3 minutes; performed clock-face test normally.  The following portions of the patient's history were reviewed and updated as appropriate: allergies,  current medications, past family history, past medical history,  past surgical history, past social history  and problem list.  Visual acuity was not assessed per patient preference since she has regular follow up with her ophthalmologist. Hearing and body mass index were assessed and reviewed.   During the course of the visit the patient was educated and counseled about appropriate screening and preventive services including : fall prevention , diabetes screening, nutrition counseling, colorectal cancer screening, and recommended immunizations.    CC: The primary encounter diagnosis was Encounter for screening mammogram for malignant neoplasm of breast. Diagnoses of Hypertension, essential, Dyslipidemia, Impaired fasting glucose, Other fatigue, Breast screening, Myalgia due to statin, and Aortic atherosclerosis (HCC) were also pertinent to this visit.    She has no bothersome issues except chronic constipation. Taking align and 2 fiber pills daily     History Sheila Osborne has a past medical history of Allergy, Anxiety, Aortic stenosis, Arthritis, Bright red blood per rectum, Carotid bruit, Dyspnea, Heart murmur, Hyperlipidemia, Hypertension, and Syncope (2012).   She has a past surgical history that includes Colonoscopy (2006); Cataract extraction (2014); Cardiac catheterization (Bilateral, 02/13/2016); Aortic valve replacement (04/26/2016); Cardiac valve replacement; Colonoscopy with propofol (N/A, 08/25/2016); Breast biopsy (Left, 1997); and Colonoscopy with propofol (N/A, 07/15/2021).   Her family history includes Cancer in her father and paternal grandfather; Early death in her father and paternal grandfather; Hyperlipidemia in her mother; Hypertension in her mother, sister, and sister; Stroke in her maternal grandfather, maternal grandmother, mother, sister, and sister.She reports that she has  never smoked. She has never used smokeless tobacco. She reports current alcohol use. She reports that she does  not use drugs.  Outpatient Medications Prior to Visit  Medication Sig Dispense Refill  . amLODipine (NORVASC) 5 MG tablet Take 5 mg by mouth 2 (two) times daily.    . AMOXICILLIN PO Take by mouth.    Marland Kitchen aspirin 325 MG tablet Take 325 mg by mouth daily.    . furosemide (LASIX) 20 MG tablet Take 20 mg by mouth as needed.    . vitamin C (ASCORBIC ACID) 500 MG tablet Take 500 mg by mouth daily.    Marland Kitchen lidocaine (LIDODERM) 5 % Place 1 patch onto the skin every 12 (twelve) hours. Remove & Discard patch within 12 hours or as directed by MD 10 patch 0   No facility-administered medications prior to visit.    Review of Systems  Patient denies headache, fevers, malaise, unintentional weight loss, skin rash, eye pain, sinus congestion and sinus pain, sore throat, dysphagia,  hemoptysis , cough, dyspnea, wheezing, chest pain, palpitations, orthopnea, edema, abdominal pain, nausea, melena, diarrhea, constipation, flank pain, dysuria, hematuria, urinary  Frequency, nocturia, numbness, tingling, seizures,  Focal weakness, Loss of consciousness,  Tremor, insomnia, depression, anxiety, and suicidal ideation.     Objective:  BP 126/78   Pulse 79   Temp 98.2 F (36.8 C) (Oral)   Ht 5\' 5"  (1.651 m)   Wt 138 lb (62.6 kg)   SpO2 99%   BMI 22.96 kg/m   Physical Exam Vitals reviewed.  Constitutional:      General: She is not in acute distress.    Appearance: Normal appearance. She is well-developed and normal weight. She is not ill-appearing, toxic-appearing or diaphoretic.  HENT:     Head: Normocephalic.     Right Ear: Tympanic membrane, ear canal and external ear normal. There is no impacted cerumen.     Left Ear: Tympanic membrane, ear canal and external ear normal. There is no impacted cerumen.     Nose: Nose normal.     Mouth/Throat:     Mouth: Mucous membranes are moist.     Pharynx: Oropharynx is clear.  Eyes:     General: No scleral icterus.       Right eye: No discharge.        Left eye:  No discharge.     Conjunctiva/sclera: Conjunctivae normal.     Pupils: Pupils are equal, round, and reactive to light.  Neck:     Thyroid: No thyromegaly.     Vascular: No carotid bruit or JVD.  Cardiovascular:     Rate and Rhythm: Normal rate and regular rhythm.     Heart sounds: Normal heart sounds.  Pulmonary:     Effort: Pulmonary effort is normal. No respiratory distress.     Breath sounds: Normal breath sounds.  Chest:  Breasts:    Breasts are symmetrical.     Right: Normal. No swelling.     Left: Normal. No swelling, inverted nipple, mass, nipple discharge, skin change or tenderness.     Comments: Deferred by patient  Abdominal:     General: Bowel sounds are normal.     Palpations: Abdomen is soft. There is no mass.     Tenderness: There is no abdominal tenderness. There is no guarding or rebound.  Musculoskeletal:        General: Normal range of motion.     Cervical back: Normal range of motion and neck supple.  Lymphadenopathy:  Cervical: No cervical adenopathy.     Upper Body:     Right upper body: No supraclavicular, axillary or pectoral adenopathy.     Left upper body: No supraclavicular, axillary or pectoral adenopathy.  Skin:    General: Skin is warm and dry.  Neurological:     General: No focal deficit present.     Mental Status: She is alert and oriented to person, place, and time. Mental status is at baseline.  Psychiatric:        Mood and Affect: Mood normal.        Behavior: Behavior normal.        Thought Content: Thought content normal.        Judgment: Judgment normal.    Assessment & Plan:  Encounter for screening mammogram for malignant neoplasm of breast -     3D Screening Mammogram, Left and Right; Future  Hypertension, essential Assessment & Plan: Well controlled on current regimen of amlodipine and metoprolol. HOme readings have been checked regularly and have been < 130/80.  Most recent 126/78 last week Renal function is ude    Orders: -     Comprehensive metabolic panel -     Microalbumin / creatinine urine ratio  Dyslipidemia -     Lipid panel -     LDL cholesterol, direct  Impaired fasting glucose -     Hemoglobin A1c  Other fatigue -     TSH -     CBC with Differential/Platelet  Breast screening  Myalgia due to statin Assessment & Plan: She again attempted weekly yuse of stATIN WHICH CAUSED RECURRENT MYALGIAS AND HEADACHE   Aortic atherosclerosis (HCC) Assessment & Plan: Noted on recent chest film.  Discussed  this indication for statin therapy g: she is statin intolerant due to recurrent myalgias and headaches. .  She would like quantification of the degree of AA.  Advised to request coronary calcium CT form her cardiologist at her visit in August        I provided 40 minutes of  face-to-face time during this encounter reviewing patient's current problems and past surgeries,  recent labs and imaging studies, providing counseling on the above mentioned problems , and coordination  of care .   Follow-up: Return in about 4 weeks (around 01/17/2023).   Sherlene Shams, MD

## 2022-12-20 NOTE — Telephone Encounter (Signed)
Spoke with Sheila Osborne and she stated that she would call back when she gets her mammogram scheduled since the follow up with Dr. Darrick Huntsman is about the mammogram results.

## 2022-12-20 NOTE — Telephone Encounter (Signed)
Pt called back to make her 4 weeks f/u. Pt its scheduled for 01/19/23 @ 11am with provider.

## 2022-12-21 ENCOUNTER — Encounter: Payer: Self-pay | Admitting: Podiatry

## 2022-12-21 ENCOUNTER — Ambulatory Visit (INDEPENDENT_AMBULATORY_CARE_PROVIDER_SITE_OTHER): Payer: Medicare Other | Admitting: Podiatry

## 2022-12-21 VITALS — BP 173/88 | HR 71

## 2022-12-21 DIAGNOSIS — L600 Ingrowing nail: Secondary | ICD-10-CM | POA: Diagnosis not present

## 2022-12-21 NOTE — Progress Notes (Signed)
   Chief Complaint  Patient presents with   Nail Problem    "I saw Dr. Logan Bores about 2.5 yrs ago.  He treated me for an ingrown toenail.  I have been dealing with it since then.  I want Dr. Logan Bores to look at it make sure it's not ingrowing again."    Subjective: Patient presents today for evaluation of pain to the medial border left great toe. Patient is concerned for possible ingrown nail.  It is very sensitive to touch.  Patient presents today for further treatment and evaluation.  Past Medical History:  Diagnosis Date   Allergy    Anxiety    Aortic stenosis    Arthritis    Bright red blood per rectum    Carotid bruit    Dyspnea    Heart murmur    Hyperlipidemia    Hypertension    Syncope 2012    Objective:  General: Well developed, nourished, in no acute distress, alert and oriented x3   Dermatology: Skin is warm, dry and supple bilateral.  Medial border left great toe is tender with evidence of an ingrowing nail. Pain on palpation noted to the border of the nail fold. The remaining nails appear unremarkable at this time. There are no open sores, lesions.  Vascular: DP and PT pulses palpable.  No clinical evidence of vascular compromise  Neruologic: Grossly intact via light touch bilateral.  Musculoskeletal: No pedal deformity noted  Assesement: #1 Paronychia with ingrowing nail medial border left great toe  Plan of Care:  1. Patient evaluated.  2. Discussed treatment alternatives and plan of care. Explained nail avulsion procedure and post procedure course to patient. 3. Patient opted for permanent partial nail avulsion of the ingrown portion of the nail.  4. Prior to procedure, local anesthesia infiltration utilized using 3 ml of a 50:50 mixture of 2% plain lidocaine and 0.5% plain marcaine in a normal hallux block fashion and a betadine prep performed.  5. Partial permanent nail avulsion with chemical matrixectomy performed using 3x30sec applications of phenol followed  by alcohol flush.  6. Light dressing applied.  Post care instructions provided 7.  Return to clinic 3 weeks  Felecia Shelling, DPM [facility]  Dr. Felecia Shelling, DPM    [address] [place], [place] [address]                Office [phone]  Fax [phone]

## 2022-12-28 ENCOUNTER — Ambulatory Visit
Admission: RE | Admit: 2022-12-28 | Discharge: 2022-12-28 | Disposition: A | Discharge status: Home / Self Care | Payer: Medicare Other | Source: Ambulatory Visit | Attending: Internal Medicine | Admitting: Internal Medicine

## 2022-12-28 DIAGNOSIS — Z1231 Encounter for screening mammogram for malignant neoplasm of breast: Secondary | ICD-10-CM | POA: Insufficient documentation

## 2023-01-05 ENCOUNTER — Encounter (INDEPENDENT_AMBULATORY_CARE_PROVIDER_SITE_OTHER): Payer: Self-pay

## 2023-01-11 ENCOUNTER — Encounter: Payer: Self-pay | Admitting: Podiatry

## 2023-01-11 ENCOUNTER — Ambulatory Visit (INDEPENDENT_AMBULATORY_CARE_PROVIDER_SITE_OTHER): Payer: Medicare Other | Admitting: Podiatry

## 2023-01-11 DIAGNOSIS — L600 Ingrowing nail: Secondary | ICD-10-CM | POA: Diagnosis not present

## 2023-01-11 NOTE — Progress Notes (Signed)
   Chief Complaint  Patient presents with   Nail Problem    "It's doing great."    Subjective: 87 y.o. female presents today status post permanent nail avulsion procedure of the medial border left great toe that was performed on 12/21/2022.  Patient doing well.  No new complaints.   Past Medical History:  Diagnosis Date   Allergy    Anxiety    Aortic stenosis    Arthritis    Bright red blood per rectum    Carotid bruit    Dyspnea    Heart murmur    Hyperlipidemia    Hypertension    Syncope 2012    Objective: Neurovascular status intact.  Skin is warm, dry and supple. Nail and respective nail fold appears to be healing appropriately.   Assessment: #1 s/p partial permanent nail matrixectomy medial border left great toe   Plan of care: #1 patient was evaluated  #2 light debridement of the periungual debris was performed to the border of the respective toe and nail plate using a tissue nipper. #3 patient is to return to clinic on a PRN basis.   Felecia Shelling, DPM [facility]  Dr. Felecia Shelling, DPM    [address] [place], [place] [address]                Office [phone]  Fax [phone]

## 2023-01-19 ENCOUNTER — Encounter: Payer: Self-pay | Admitting: Internal Medicine

## 2023-01-19 ENCOUNTER — Ambulatory Visit (INDEPENDENT_AMBULATORY_CARE_PROVIDER_SITE_OTHER): Payer: Medicare Other | Admitting: Internal Medicine

## 2023-01-19 VITALS — BP 134/68 | HR 62 | Temp 97.5°F | Ht 65.0 in | Wt 136.6 lb

## 2023-01-19 DIAGNOSIS — I359 Nonrheumatic aortic valve disorder, unspecified: Secondary | ICD-10-CM

## 2023-01-19 DIAGNOSIS — I10 Essential (primary) hypertension: Secondary | ICD-10-CM | POA: Diagnosis not present

## 2023-01-19 DIAGNOSIS — I700 Atherosclerosis of aorta: Secondary | ICD-10-CM | POA: Diagnosis not present

## 2023-01-19 DIAGNOSIS — R7301 Impaired fasting glucose: Secondary | ICD-10-CM | POA: Diagnosis not present

## 2023-01-19 DIAGNOSIS — R5383 Other fatigue: Secondary | ICD-10-CM | POA: Diagnosis not present

## 2023-01-19 LAB — CBC WITH DIFFERENTIAL/PLATELET
Basophils Absolute: 0 10*3/uL (ref 0.0–0.1)
Basophils Relative: 0.6 % (ref 0.0–3.0)
Eosinophils Absolute: 0 10*3/uL (ref 0.0–0.7)
Eosinophils Relative: 0.7 % (ref 0.0–5.0)
HCT: 39.5 % (ref 36.0–46.0)
Hemoglobin: 13 g/dL (ref 12.0–15.0)
Lymphocytes Relative: 26.6 % (ref 12.0–46.0)
Lymphs Abs: 1.2 10*3/uL (ref 0.7–4.0)
MCHC: 33 g/dL (ref 30.0–36.0)
MCV: 94.6 fl (ref 78.0–100.0)
Monocytes Absolute: 0.4 10*3/uL (ref 0.1–1.0)
Monocytes Relative: 9.9 % (ref 3.0–12.0)
Neutro Abs: 2.7 10*3/uL (ref 1.4–7.7)
Neutrophils Relative %: 62.2 % (ref 43.0–77.0)
Platelets: 275 10*3/uL (ref 150.0–400.0)
RBC: 4.17 Mil/uL (ref 3.87–5.11)
RDW: 12.5 % (ref 11.5–15.5)
WBC: 4.3 10*3/uL (ref 4.0–10.5)

## 2023-01-19 LAB — COMPREHENSIVE METABOLIC PANEL
ALT: 10 U/L (ref 0–35)
AST: 17 U/L (ref 0–37)
Albumin: 4.4 g/dL (ref 3.5–5.2)
Alkaline Phosphatase: 46 U/L (ref 39–117)
BUN: 14 mg/dL (ref 6–23)
CO2: 32 mEq/L (ref 19–32)
Calcium: 9.9 mg/dL (ref 8.4–10.5)
Chloride: 99 mEq/L (ref 96–112)
Creatinine, Ser: 0.8 mg/dL (ref 0.40–1.20)
GFR: 67.52 mL/min (ref >60.00)
Glucose, Bld: 106 mg/dL — ABNORMAL HIGH (ref 70–99)
Potassium: 3.9 mEq/L (ref 3.5–5.1)
Sodium: 138 mEq/L (ref 135–145)
Total Bilirubin: 0.4 mg/dL (ref 0.2–1.2)
Total Protein: 7.2 g/dL (ref 6.0–8.3)

## 2023-01-19 LAB — LIPID PANEL
Cholesterol: 248 mg/dL — ABNORMAL HIGH (ref 0–200)
HDL: 86.5 mg/dL (ref >39.00)
LDL Cholesterol: 146 mg/dL — ABNORMAL HIGH (ref 0–99)
NonHDL: 161.27
Total CHOL/HDL Ratio: 3
Triglycerides: 76 mg/dL (ref 0.0–149.0)
VLDL: 15.2 mg/dL (ref 0.0–40.0)

## 2023-01-19 LAB — TSH: TSH: 0.69 u[IU]/mL (ref 0.35–5.50)

## 2023-01-19 LAB — HEMOGLOBIN A1C: Hgb A1c MFr Bld: 5.4 % (ref 4.6–6.5)

## 2023-01-19 LAB — LDL CHOLESTEROL, DIRECT: Direct LDL: 159 mg/dL

## 2023-01-19 NOTE — Progress Notes (Unsigned)
Subjective:  Patient ID: Sheila Osborne, female    DOB: [birth year]  Age: 87 y.o. MRN: [id number]  CC: The primary encounter diagnosis was Hypertension, essential. Diagnoses of Impaired fasting glucose, Other fatigue, Aortic atherosclerosis (HCC), and Aortic valve disease were also pertinent to this visit.   HPI Arieana Butenschon Mcfadden presents for  Chief Complaint  Patient presents with   Medical Management of Chronic Issues    4 week follow up    1) HTN:  home readings on a regimen of amlodipine 5  mg bid have been 120/75 .  She is experienicng no s/e except mild edema with prolonged sitting.   2) Reviewed recent cardiology evaluation and Holter monitor.  She has an ECHO ordered.     Outpatient Medications Prior to Visit  Medication Sig Dispense Refill   amLODipine (NORVASC) 5 MG tablet Take 5 mg by mouth 2 (two) times daily.     AMOXICILLIN PO Take by mouth.     aspirin 325 MG tablet Take 325 mg by mouth daily.     FIBER PO Take 1 tablet by mouth daily.     furosemide (LASIX) 20 MG tablet Take 20 mg by mouth as needed.     Multiple Vitamin (MULTIVITAMIN) tablet Take 1 tablet by mouth daily.     Omega-3 Fatty Acids (FISH OIL) 600 MG CAPS Take 1 capsule by mouth daily.     vitamin C (ASCORBIC ACID) 500 MG tablet Take 500 mg by mouth daily.     No facility-administered medications prior to visit.    Review of Systems;  Patient denies headache, fevers, malaise, unintentional weight loss, skin rash, eye pain, sinus congestion and sinus pain, sore throat, dysphagia,  hemoptysis , cough, dyspnea, wheezing, chest pain, palpitations, orthopnea, edema, abdominal pain, nausea, melena, diarrhea, constipation, flank pain, dysuria, hematuria, urinary  Frequency, nocturia, numbness, tingling, seizures,  Focal weakness, Loss of consciousness,  Tremor, insomnia, depression, anxiety, and suicidal ideation.      Objective:  BP 134/68   Pulse 62   Temp (!) 97.5 F (36.4 C)  (Oral)   Ht 5\' 5"  (1.651 m)   Wt 136 lb 9.6 oz (62 kg)   SpO2 98%   BMI 22.73 kg/m   BP Readings from Last 3 Encounters:  01/19/23 134/68  12/21/22 (!) 173/88  12/20/22 126/78    Wt Readings from Last 3 Encounters:  01/19/23 136 lb 9.6 oz (62 kg)  12/20/22 138 lb (62.6 kg)  07/26/22 135 lb (61.2 kg)    Physical Exam Vitals reviewed.  Constitutional:      General: She is not in acute distress.    Appearance: Normal appearance. She is normal weight. She is not ill-appearing, toxic-appearing or diaphoretic.  HENT:     Head: Normocephalic.  Eyes:     General: No scleral icterus.       Right eye: No discharge.        Left eye: No discharge.     Conjunctiva/sclera: Conjunctivae normal.  Cardiovascular:     Rate and Rhythm: Normal rate and regular rhythm.     Heart sounds: Normal heart sounds.  Pulmonary:     Effort: Pulmonary effort is normal. No respiratory distress.     Breath sounds: Normal breath sounds.  Musculoskeletal:        General: Normal range of motion.  Skin:    General: Skin is warm and dry.  Neurological:     General: No focal deficit present.  Mental Status: She is alert and oriented to person, place, and time. Mental status is at baseline.  Psychiatric:        Mood and Affect: Mood normal.        Behavior: Behavior normal.        Thought Content: Thought content normal.        Judgment: Judgment normal.    Lab Results  Component Value Date   HGBA1C 5.4 01/19/2023    Lab Results  Component Value Date   CREATININE 0.80 01/19/2023   CREATININE 0.77 12/10/2021   CREATININE 0.76 12/16/2020    Lab Results  Component Value Date   WBC 4.3 01/19/2023   HGB 13.0 01/19/2023   HCT 39.5 01/19/2023   PLT 275.0 01/19/2023   GLUCOSE 106 (H) 01/19/2023   CHOL 248 (H) 01/19/2023   TRIG 76.0 01/19/2023   HDL 86.50 01/19/2023   LDLDIRECT 159.0 01/19/2023   LDLCALC 146 (H) 01/19/2023   ALT 10 01/19/2023   AST 17 01/19/2023   NA 138 01/19/2023   K  3.9 01/19/2023   CL 99 01/19/2023   CREATININE 0.80 01/19/2023   BUN 14 01/19/2023   CO2 32 01/19/2023   TSH 0.69 01/19/2023   HGBA1C 5.4 01/19/2023   MICROALBUR <0.7 12/20/2022    MM 3D SCREENING MAMMOGRAM BILATERAL BREAST  Result Date: 12/30/2022 CLINICAL DATA:  Screening. EXAM: DIGITAL SCREENING BILATERAL MAMMOGRAM WITH TOMOSYNTHESIS AND CAD TECHNIQUE: Bilateral screening digital craniocaudal and mediolateral oblique mammograms were obtained. Bilateral screening digital breast tomosynthesis was performed. The images were evaluated with computer-aided detection. COMPARISON:  Previous exam(s). ACR Breast Density Category c: The breasts are heterogeneously dense, which may obscure small masses. FINDINGS: There are no findings suspicious for malignancy. IMPRESSION: No mammographic evidence of malignancy. A result letter of this screening mammogram will be mailed directly to the patient. RECOMMENDATION: Screening mammogram in one year. (Code:SM-B-01Y) BI-RADS CATEGORY  1: Negative. Electronically Signed   By: Elberta Fortis M.D.   On: 12/30/2022 09:48    Assessment & Plan:  .Hypertension, essential Assessment & Plan: Well controlled on current regimen of amlodipine  5 mg bid. Metoprolol was stopped due to symptomatic bradycardia with 3.5 sec pause  Home readings have been checked regularly and have been < 130/80.  Most recent 126/78 last week Renal function is normal  Lab Results  Component Value Date   CREATININE 0.80 01/19/2023   Lab Results  Component Value Date   NA 138 01/19/2023   K 3.9 01/19/2023   CL 99 01/19/2023   CO2 32 01/19/2023   Lab Results  Component Value Date   MICROALBUR <0.7 12/20/2022       Orders: -     LDL cholesterol, direct -     Lipid panel  Impaired fasting glucose -     Hemoglobin A1c -     Comprehensive metabolic panel  Other fatigue -     CBC with Differential/Platelet -     TSH  Aortic atherosclerosis (HCC) Assessment & Plan: Untreated  due to statin myalgias and headaches. She would like quantification of the degree of AA.  Advised to request coronary calcium CT from her cardiologist but her request was not entertained for unclear reasons at her visit in August    Aortic valve disease Assessment & Plan: Congenitally bicuspid valve,  S/p AV replacement in 2017       I provided 30 minutes of face-to-face time during this encounter reviewing patient's last visit with me, patient's  most recent visit with cardiology,  nephrology,  and neurology,  recent surgical and non surgical procedures, previous  labs and imaging studies, counseling on currently addressed issues,  and post visit ordering to diagnostics and therapeutics .   Follow-up: Return in about 1 year (around 01/19/2024).   Sherlene Shams, MD

## 2023-01-19 NOTE — Patient Instructions (Addendum)
You are doing exceedingly well!    I'll e mail you the labs with my comments

## 2023-01-20 ENCOUNTER — Encounter: Payer: Self-pay | Admitting: Internal Medicine

## 2023-01-20 NOTE — Assessment & Plan Note (Signed)
Congenitally bicuspid valve,  S/p AV replacement in 2017

## 2023-01-20 NOTE — Assessment & Plan Note (Addendum)
Untreated due to statin myalgias and headaches. She would like quantification of the degree of AA.  Advised to request coronary calcium CT from her cardiologist but her request was not entertained for unclear reasons at her visit in August

## 2023-01-20 NOTE — Assessment & Plan Note (Addendum)
Well controlled on current regimen of amlodipine  5 mg bid. Metoprolol was stopped due to symptomatic bradycardia with 3.5 sec pause  Home readings have been checked regularly and have been < 130/80.  Most recent 126/78 last week Renal function is normal  Lab Results  Component Value Date   CREATININE 0.80 01/19/2023   Lab Results  Component Value Date   NA 138 01/19/2023   K 3.9 01/19/2023   CL 99 01/19/2023   CO2 32 01/19/2023   Lab Results  Component Value Date   MICROALBUR <0.7 12/20/2022

## 2023-03-03 ENCOUNTER — Emergency Department: Payer: Medicare Other

## 2023-03-03 ENCOUNTER — Encounter: Payer: Self-pay | Admitting: Emergency Medicine

## 2023-03-03 ENCOUNTER — Emergency Department
Admission: EM | Admit: 2023-03-03 | Discharge: 2023-03-03 | Disposition: A | Discharge status: Home / Self Care | Payer: Medicare Other | Attending: Emergency Medicine | Admitting: Emergency Medicine

## 2023-03-03 ENCOUNTER — Other Ambulatory Visit: Payer: Self-pay

## 2023-03-03 DIAGNOSIS — S060X1A Concussion with loss of consciousness of 30 minutes or less, initial encounter: Secondary | ICD-10-CM | POA: Insufficient documentation

## 2023-03-03 DIAGNOSIS — S0990XA Unspecified injury of head, initial encounter: Secondary | ICD-10-CM | POA: Diagnosis present

## 2023-03-03 DIAGNOSIS — W01198A Fall on same level from slipping, tripping and stumbling with subsequent striking against other object, initial encounter: Secondary | ICD-10-CM | POA: Diagnosis not present

## 2023-03-03 DIAGNOSIS — Z23 Encounter for immunization: Secondary | ICD-10-CM | POA: Diagnosis not present

## 2023-03-03 DIAGNOSIS — R55 Syncope and collapse: Secondary | ICD-10-CM

## 2023-03-03 DIAGNOSIS — S0181XA Laceration without foreign body of other part of head, initial encounter: Secondary | ICD-10-CM | POA: Diagnosis not present

## 2023-03-03 DIAGNOSIS — I10 Essential (primary) hypertension: Secondary | ICD-10-CM | POA: Diagnosis not present

## 2023-03-03 MED ORDER — TETANUS-DIPHTH-ACELL PERTUSSIS 5-2.5-18.5 LF-MCG/0.5 IM SUSY
0.5000 mL | PREFILLED_SYRINGE | Freq: Once | INTRAMUSCULAR | Status: AC
  Administered 2023-03-03: 0.5 mL via INTRAMUSCULAR
  Filled 2023-03-03: qty 0.5

## 2023-03-03 NOTE — ED Notes (Addendum)
Pt to CT at this time.

## 2023-03-03 NOTE — Discharge Instructions (Addendum)
Please use ibuprofen (Motrin) up to 800 mg every 8 hours, naproxen (Naprosyn) up to 500 mg every 12 hours, and/or acetaminophen (Tylenol) up to 4 g/day for any continued pain.  Please do not use this medication regimen for longer than 7 days

## 2023-03-03 NOTE — ED Triage Notes (Signed)
Patient to ED via [facility] from home after a syncopal episode. Pt states she was urinating when she passed out hitting her head. Denies blood thinners. Aox4 a this time. Laceration noted to top of head- bleeding controlled.

## 2023-03-03 NOTE — ED Provider Notes (Addendum)
[facility] Provider Note   Event Date/Time   First MD Initiated Contact with Patient 03/03/23 [phone]     (approximate) History  Loss of Consciousness  HPI Sheila Osborne is a 87 y.o. female with a past medical history of hypertension, hyperlipidemia, and previous valve replacement of the aortic valve who presents complaining of syncope after using the bathroom today.  Patient states that when she stood up she became very lightheaded and passed out before waking up on the ground with a laceration to the right forehead.  Patient states that she hit her head on the wall resulting in this laceration and was initially hesitant to call 911 so waited approximately 1 hour prior to calling.  Patient denies any blood thinner use but states she does take 325 mg aspirin daily.  Patient endorses some mild nausea but denies any vomiting ROS: Patient currently denies any vision changes, tinnitus, difficulty speaking, facial droop, sore throat, chest pain, shortness of breath, abdominal pain, vomiting/diarrhea, dysuria, or weakness/numbness/paresthesias in any extremity   Physical Exam  Triage Vital Signs: ED Triage Vitals  Encounter Vitals Group     BP      Systolic BP Percentile      Diastolic BP Percentile      Pulse      Resp      Temp      Temp src      SpO2      Weight      Height      Head Circumference      Peak Flow      Pain Score      Pain Loc      Pain Education      Exclude from Growth Chart    Most recent vital signs: Vitals:   03/03/23 0855 03/03/23 0930  BP: (!) 141/73 (!) 101/34  Pulse: 71 77  Resp: 18 (!) 22  Temp: 97.7 F (36.5 C)   SpO2: 98% 100%   General: Awake, oriented x4. CV:  Good peripheral perfusion.  Resp:  Normal effort.  Abd:  No distention.  Other:  Elderly well-developed, well-nourished Caucasian female resting comfortably in no acute distress.  3 cm vertical superficial laceration to the right forehead that tracks  into the hairline ED Results / Procedures / Treatments  Labs (all labs ordered are listed, but only abnormal results are displayed) Labs Reviewed - No data to display EKG ED ECG REPORT I, Merwyn Katos, the attending physician, personally viewed and interpreted this ECG. Date: 03/03/2023 EKG Time: 0859 Rate: 69 Rhythm: normal sinus rhythm QRS Axis: normal Intervals: normal ST/T Wave abnormalities: normal Narrative Interpretation: no evidence of acute ischemia RADIOLOGY ED MD interpretation: CT of the head without contrast interpreted by me shows no evidence of acute abnormalities including no intracerebral hemorrhage, obvious masses, or significant edema.  There is evidence of a laceration to the right front forehead  CT of the cervical spine interpreted by me does not show any evidence of acute abnormalities including no acute fracture, malalignment, height loss, or dislocation -Agree with radiology assessment Official radiology report(s): CT Cervical Spine Wo Contrast  Result Date: 03/03/2023 CLINICAL DATA:  87 year old female with syncope while urinating. Struck head. EXAM: CT CERVICAL SPINE WITHOUT CONTRAST TECHNIQUE: Multidetector CT imaging of the cervical spine was performed without intravenous contrast. Multiplanar CT image reconstructions were also generated. RADIATION DOSE REDUCTION: This exam was performed according to the departmental dose-optimization program which includes automated exposure control,  adjustment of the mA and/or kV according to patient size and/or use of iterative reconstruction technique. COMPARISON:  Brain MRI 12/19/2020. FINDINGS: Alignment: Straightening of cervical lordosis with subtle degenerative appearing anterolisthesis C2-C3, C3-C4, C7-T1. Bilateral posterior element alignment is within normal limits. Skull base and vertebrae: Visualized skull base is intact. No atlanto-occipital dissociation. C1 and C2 appear intact and aligned. No acute osseous  abnormality identified. Soft tissues and spinal canal: No prevertebral fluid or swelling. No visible canal hematoma. Postinflammatory tonsillar calcifications. Negative visible noncontrast neck soft tissues. Disc levels: Multilevel cervical spine degeneration, did generally age-appropriate. No CT evidence of cervical spinal stenosis. Upper chest: Partially visible levoconvex upper thoracic scoliosis. Grossly intact visible upper thoracic levels. Negative lung apices. IMPRESSION: 1. No acute traumatic injury identified in the cervical spine. 2. Generally age-appropriate cervical spine degeneration. Electronically Signed   By: Odessa Fleming M.D.   On: 03/03/2023 09:22   CT Head Wo Contrast  Result Date: 03/03/2023 CLINICAL DATA:  87 year old female with syncope while urinating. Struck head. EXAM: CT HEAD WITHOUT CONTRAST TECHNIQUE: Contiguous axial images were obtained from the base of the skull through the vertex without intravenous contrast. RADIATION DOSE REDUCTION: This exam was performed according to the departmental dose-optimization program which includes automated exposure control, adjustment of the mA and/or kV according to patient size and/or use of iterative reconstruction technique. COMPARISON:  Brain MRI 12/19/2020.  Head CT 12/17/2020. FINDINGS: Brain: Advanced chronic small vessel disease with heterogeneous hypodensity in the bilateral cerebral white matter and bilateral deep gray nuclei, chronically severe in the right caudate. Stable gray-white matter differentiation throughout the brain. Stable cerebral volume since 2022. No midline shift, ventriculomegaly, mass effect, evidence of mass lesion, intracranial hemorrhage or evidence of cortically based acute infarction. Vascular: Calcified atherosclerosis at the skull base. No suspicious intracranial vascular hyperdensity. Skull: Stable.  No fracture identified. Sinuses/Orbits: Visualized paranasal sinuses and mastoids are stable and well aerated. Other:  Right anterior scalp, forehead hematoma and small laceration (series 3, image 40). No tracking scalp soft tissue gas. Underlying right frontal bone and hypoplastic right frontal sinus appear intact.87 other orbit and scalp soft tissues appear negative. IMPRESSION: 1. Right forehead, anterior scalp hematoma and laceration. No underlying skull fracture. 2. No acute intracranial abnormality. Stable non contrast CT appearance of chronically advanced small vessel disease since 2022. Electronically Signed   By: Odessa Fleming M.D.   On: 03/03/2023 09:19   PROCEDURES: Critical Care performed: No .1-3 Lead EKG Interpretation  Performed by: Merwyn Katos, MD Authorized by: Merwyn Katos, MD     Interpretation: normal     ECG rate:  71   ECG rate assessment: normal     Rhythm: sinus rhythm     Ectopy: none     Conduction: normal   ..Laceration Repair  Date/Time: 03/03/2023 9:56 AM  Performed by: Merwyn Katos, MD Authorized by: Merwyn Katos, MD   Consent:    Consent obtained:  Verbal   Consent given by:  Patient   Risks, benefits, and alternatives were discussed: yes     Risks discussed:  Infection, pain, retained foreign body, need for additional repair, poor cosmetic result, tendon damage, vascular damage, poor wound healing and nerve damage   Alternatives discussed:  No treatment, delayed treatment, observation and referral Universal protocol:    Immediately prior to procedure, a time out was called: yes     Patient identity confirmed:  Verbally with patient Anesthesia:    Anesthesia method:  None Laceration  details:    Location:  Scalp   Scalp location:  Frontal   Length (cm):  3   Depth (mm):  3 Pre-procedure details:    Preparation:  Patient was prepped and draped in usual sterile fashion Exploration:    Wound exploration: entire depth of wound visualized     Contaminated: no   Treatment:    Area cleansed with:  Povidone-iodine and saline   Amount of cleaning:  Standard    Irrigation solution:  Sterile saline   Irrigation method:  Syringe Skin repair:    Repair method:  Tissue adhesive Approximation:    Approximation:  Close Repair type:    Repair type:  Simple Post-procedure details:    Dressing:  Antibiotic ointment and non-adherent dressing   Procedure completion:  Tolerated well, no immediate complications  MEDICATIONS ORDERED IN ED: Medications  Tdap (BOOSTRIX) injection 0.5 mL (0.5 mLs Intramuscular Given 03/03/23 0933)   IMPRESSION / MDM / ASSESSMENT AND PLAN / ED COURSE  I reviewed the triage vital signs and the nursing notes.                             The patient is on the cardiac monitor to evaluate for evidence of arrhythmia and/or significant heart rate changes. Patient's presentation is most consistent with acute presentation with potential threat to life or bodily function. Presenting after a fall that occurred just prior to arrival, resulting in injury to the right upper forehead. The mechanism of injury was a mechanical ground level fall without syncope or near-syncope. The current level of pain is moderate. There was no loss of consciousness, confusion, seizure, or memory impairment. There is not a laceration associated with the injury. Denies neck pain. The patient does not take blood thinner medications. Denies vomiting, numbness/weakness, fever  Dispo: Discharge with PCP follow-up   FINAL CLINICAL IMPRESSION(S) / ED DIAGNOSES   Final diagnoses:  Syncope and collapse  Injury of head, initial encounter  Laceration of forehead, initial encounter  Concussion with loss of consciousness of 30 minutes or less, initial encounter   Rx / DC Orders   ED Discharge Orders     None      Note:  This document was prepared using Dragon voice recognition software and may include unintentional dictation errors.   Merwyn Katos, MD 03/03/23 6010    Merwyn Katos, MD 03/03/23 [phone]

## 2023-03-22 ENCOUNTER — Ambulatory Visit: Payer: Medicare Other | Admitting: Internal Medicine

## 2023-03-22 ENCOUNTER — Encounter: Payer: Self-pay | Admitting: Internal Medicine

## 2023-03-22 VITALS — BP 142/66 | HR 80 | Ht 65.0 in | Wt 133.8 lb

## 2023-03-22 DIAGNOSIS — Z23 Encounter for immunization: Secondary | ICD-10-CM | POA: Diagnosis not present

## 2023-03-22 DIAGNOSIS — R55 Syncope and collapse: Secondary | ICD-10-CM

## 2023-03-22 DIAGNOSIS — F419 Anxiety disorder, unspecified: Secondary | ICD-10-CM | POA: Diagnosis not present

## 2023-03-22 NOTE — Progress Notes (Signed)
Subjective:  Patient ID: Sheila Osborne, female    DOB: 1936-05-06  Age: 87 y.o. MRN: [id number]  CC: The primary encounter diagnosis was Syncope, vasovagal. Diagnoses of Need for influenza vaccination and Anxiety were also pertinent to this visit.   HPI Sheila Osborne presents for  Chief Complaint  Patient presents with   Hospitalization Follow-up    ER follow up. Pt fainted in the bathroom at home hitting head.   87 yr old fenalel with histor y  of recrrent vasovagal syncope,  aortic valve replacement 2007 presents for ER follow up:  Patient has a post micturition syncopal event on Sept 26 and lcerated right frontal scalp.  ER evaluated and treated ,  CT head and spine done,,   superglued   EKG/telemetry monitor  discharged home after sutures were made to laceration and TDaP given  advised to refrain from using electronic media for 2 days.  No headache    Saw Paraschos oct 8  . No changes made  Previous use of Holter monitor in January    Events are triggered by anxiety. 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    Outpatient Medications Prior to Visit  Medication Sig Dispense Refill   ALPRAZolam (XANAX) 0.25 MG tablet Take 0.25 mg by mouth at bedtime as needed for anxiety. Pt states she is taking 1/2 tablet BID     amLODipine (NORVASC) 5 MG tablet Take 5 mg by mouth 2 (two) times daily.     AMOXICILLIN PO Take by mouth.     aspirin 325 MG tablet Take 325 mg by mouth daily.     FIBER PO Take 1 tablet by mouth daily.     furosemide (LASIX) 20 MG tablet Take 20 mg by mouth as needed.     Multiple Vitamin (MULTIVITAMIN) tablet Take 1 tablet by mouth daily.     nitroGLYCERIN (NITROSTAT) 0.4 MG SL tablet Place 0.4 mg under the tongue every 5 (five) minutes as needed.     Omega-3 Fatty Acids (FISH OIL) 600 MG CAPS Take 1 capsule by mouth daily.      vitamin C (ASCORBIC ACID) 500 MG tablet Take 500 mg by mouth daily.     No facility-administered medications prior to visit.    Review of Systems;  Patient denies headache, fevers, malaise, unintentional weight loss, skin rash, eye pain, sinus congestion and sinus pain, sore throat, dysphagia,  hemoptysis , cough, dyspnea, wheezing, chest pain, palpitations, orthopnea, edema, abdominal pain, nausea, melena, diarrhea, constipation, flank pain, dysuria, hematuria, urinary  Frequency, nocturia, numbness, tingling, seizures,  Focal weakness,   Tremor, insomnia, depression, and suicidal ideation.      Objective:  BP (!) 142/66   Pulse 80   Ht 5\' 5"  (1.651 m)   Wt 133 lb 12.8 oz (60.7 kg)   SpO2 97%   BMI 22.27 kg/m   BP Readings from Last 3 Encounters:  03/22/23 (!) 142/66  03/03/23 (!) 150/84  01/19/23 134/68    Wt Readings from Last 3 Encounters:  03/22/23 133 lb 12.8 oz (60.7 kg)  03/03/23 134 lb (60.8 kg)  01/19/23 136 lb 9.6 oz (62 kg)    Physical Exam Vitals reviewed.  Constitutional:      General: She is not in acute distress.    Appearance: Normal appearance. She is normal weight.  She is not ill-appearing, toxic-appearing or diaphoretic.  HENT:     Head: Normocephalic.      Comments: Healing laceration to right forehead  Eyes:     General: No scleral icterus.       Right eye: No discharge.        Left eye: No discharge.     Conjunctiva/sclera: Conjunctivae normal.  Cardiovascular:     Rate and Rhythm: Normal rate and regular rhythm.     Heart sounds: Normal heart sounds.  Pulmonary:     Effort: Pulmonary effort is normal. No respiratory distress.     Breath sounds: Normal breath sounds.  Musculoskeletal:        General: Normal range of motion.  Skin:    General: Skin is warm and dry.  Neurological:     General: No focal deficit present.     Mental Status: She is alert and oriented to person, place, and time. Mental status is at baseline.  Psychiatric:         Mood and Affect: Mood normal.        Behavior: Behavior normal.        Thought Content: Thought content normal.        Judgment: Judgment normal.    Lab Results  Component Value Date   HGBA1C 5.4 01/19/2023    Lab Results  Component Value Date   CREATININE 0.80 01/19/2023   CREATININE 0.77 12/10/2021   CREATININE 0.76 12/16/2020    Lab Results  Component Value Date   WBC 4.3 01/19/2023   HGB 13.0 01/19/2023   HCT 39.5 01/19/2023   PLT 275.0 01/19/2023   GLUCOSE 106 (H) 01/19/2023   CHOL 248 (H) 01/19/2023   TRIG 76.0 01/19/2023   HDL 86.50 01/19/2023   LDLDIRECT 159.0 01/19/2023   LDLCALC 146 (H) 01/19/2023   ALT 10 01/19/2023   AST 17 01/19/2023   NA 138 01/19/2023   K 3.9 01/19/2023   CL 99 01/19/2023   CREATININE 0.80 01/19/2023   BUN 14 01/19/2023   CO2 32 01/19/2023   TSH 0.69 01/19/2023   HGBA1C 5.4 01/19/2023   MICROALBUR <0.7 12/20/2022    CT Cervical Spine Wo Contrast  Result Date: 03/03/2023 CLINICAL DATA:  87 year old female with syncope while urinating. Struck head. EXAM: CT CERVICAL SPINE WITHOUT CONTRAST TECHNIQUE: Multidetector CT imaging of the cervical spine was performed without intravenous contrast. Multiplanar CT image reconstructions were also generated. RADIATION DOSE REDUCTION: This exam was performed according to the departmental dose-optimization program which includes automated exposure control, adjustment of the mA and/or kV according to patient size and/or use of iterative reconstruction technique. COMPARISON:  Brain MRI 12/19/2020. FINDINGS: Alignment: Straightening of cervical lordosis with subtle degenerative appearing anterolisthesis C2-C3, C3-C4, C7-T1. Bilateral posterior element alignment is within normal limits. Skull base and vertebrae: Visualized skull base is intact. No atlanto-occipital dissociation. C1 and C2 appear intact and aligned. No acute osseous abnormality identified. Soft tissues and spinal canal: No prevertebral  fluid or swelling. No visible canal hematoma. Postinflammatory tonsillar calcifications. Negative visible noncontrast neck soft tissues. Disc levels: Multilevel cervical spine degeneration, did generally age-appropriate. No CT evidence of cervical spinal stenosis. Upper chest: Partially visible levoconvex upper thoracic scoliosis. Grossly intact visible upper thoracic levels. Negative lung apices. IMPRESSION: 1. No acute traumatic injury identified in the cervical spine. 2. Generally age-appropriate cervical spine degeneration. Electronically Signed   By: Odessa Fleming M.D.   On: 03/03/2023 09:22   CT Head Wo Contrast  Result Date:  03/03/2023 CLINICAL DATA:  87 year old female with syncope while urinating. Struck head. EXAM: CT HEAD WITHOUT CONTRAST TECHNIQUE: Contiguous axial images were obtained from the base of the skull through the vertex without intravenous contrast. RADIATION DOSE REDUCTION: This exam was performed according to the departmental dose-optimization program which includes automated exposure control, adjustment of the mA and/or kV according to patient size and/or use of iterative reconstruction technique. COMPARISON:  Brain MRI 12/19/2020.  Head CT 12/17/2020. FINDINGS: Brain: Advanced chronic small vessel disease with heterogeneous hypodensity in the bilateral cerebral white matter and bilateral deep gray nuclei, chronically severe in the right caudate. Stable gray-white matter differentiation throughout the brain. Stable cerebral volume since 2022. No midline shift, ventriculomegaly, mass effect, evidence of mass lesion, intracranial hemorrhage or evidence of cortically based acute infarction. Vascular: Calcified atherosclerosis at the skull base. No suspicious intracranial vascular hyperdensity. Skull: Stable.  No fracture identified. Sinuses/Orbits: Visualized paranasal sinuses and mastoids are stable and well aerated. Other: Right anterior scalp, forehead hematoma and small laceration (series 3,  image 40). No tracking scalp soft tissue gas. Underlying right frontal bone and hypoplastic right frontal sinus appear intact.87 other orbit and scalp soft tissues appear negative. IMPRESSION: 1. Right forehead, anterior scalp hematoma and laceration. No underlying skull fracture. 2. No acute intracranial abnormality. Stable non contrast CT appearance of chronically advanced small vessel disease since 2022. Electronically Signed   By: Odessa Fleming M.D.   On: 03/03/2023 09:19    Assessment & Plan:  .Syncope, vasovagal Assessment & Plan: Recurrent   resulting in forehead laceration sept 26.  This is her 8th or 9th episode. Patient feels they are triggered by anxiety  .  Discussed using ZIO monitor to rule out arrhythmia .   I have contacted her cardiologist (Paraschos) who agrees. He is ordering   Need for influenza vaccination -     Flu Vaccine Trivalent High Dose (Fluad)  Anxiety Assessment & Plan: Given her predispositoin to faint when stressed,  encouraged her to use alprazolam sparingly     I provided 30 minutes of face-to-face time during this encounter reviewing patient's last visit with me, patient's  most recent visit with cardiology,  recent surgical and non surgical procedures, previous  labs and imaging studies, counseling on currently addressed issues,  and post visit ordering to diagnostics and therapeutics .   Follow-up: No follow-ups on file.   Sherlene Shams, MD

## 2023-03-22 NOTE — Assessment & Plan Note (Signed)
Given her predispositoin to faint when stressed,  encouraged her to use alprazolam sparingly

## 2023-03-22 NOTE — Assessment & Plan Note (Addendum)
Recurrent   resulting in forehead laceration sept 26.  This is her 8th or 9th episode. Patient feels they are triggered by anxiety  .  Discussed using ZIO monitor to rule out arrhythmia .   I have contacted her cardiologist (Paraschos) who agrees. He is ordering

## 2023-03-23 NOTE — Telephone Encounter (Signed)
Spoke with pt and she stated that she received the message and has the heart monitor on.

## 2023-04-08 ENCOUNTER — Telehealth: Payer: Self-pay

## 2023-04-08 NOTE — Telephone Encounter (Signed)
Transition Care Management Unsuccessful Follow-up Telephone Call  Date of discharge and from where:  [place] 9/26  Attempts:  1st Attempt  Reason for unsuccessful TCM follow-up call:  No answer/busy   Lenard Forth [place]  [facility], [facility] Guide, Phone: [phone] Website: [email or web address]

## 2023-04-11 ENCOUNTER — Telehealth: Payer: Self-pay

## 2023-04-11 NOTE — Telephone Encounter (Signed)
Transition Care Management Unsuccessful Follow-up Telephone Call  Date of discharge and from where:  [place] 9/26  Attempts:  2nd Attempt  Reason for unsuccessful TCM follow-up call:  No answer/busy   Lenard Forth [place]  [facility], [facility] Guide, Phone: [phone] Website: [email or web address]

## 2023-09-05 ENCOUNTER — Ambulatory Visit (INDEPENDENT_AMBULATORY_CARE_PROVIDER_SITE_OTHER): Payer: Medicare Other | Admitting: *Deleted

## 2023-09-05 VITALS — Ht 65.0 in | Wt 134.0 lb

## 2023-09-05 DIAGNOSIS — Z780 Asymptomatic menopausal state: Secondary | ICD-10-CM

## 2023-09-05 DIAGNOSIS — Z0000 Encounter for general adult medical examination without abnormal findings: Secondary | ICD-10-CM | POA: Diagnosis not present

## 2023-09-05 NOTE — Patient Instructions (Signed)
 Sheila Osborne , Thank you for taking time to come for your Medicare Wellness Visit. I appreciate your ongoing commitment to your health goals. Please review the following plan we discussed and let me know if I can assist you in the future.   Referrals/Orders/Follow-Ups/Clinician Recommendations: Your bone density had been ordered. Remember to update your pneumonia vaccine at next office visit. You have an order for:  []   2D Mammogram  []   3D Mammogram  [x]   Bone Density     Please call for appointment:  [facility]  [address] [place] [address] [phone]    Make sure to wear two-piece clothing.  No lotions, powders, or deodorants the day of the appointment. Make sure to bring picture ID and insurance card.  Bring list of medications you are currently taking including any supplements.    This is a list of the screening recommended for you and due dates:  Health Maintenance  Topic Date Due   Pneumonia Vaccine (2 of 2 - PPSV23 or PCV20) 09/11/2020   COVID-19 Vaccine (5 - 2024-25 season) 02/06/2023   Mammogram  12/28/2023   Medicare Annual Wellness Visit  09/04/2024   DTaP/Tdap/Td vaccine (2 - Td or Tdap) 03/02/2033   Flu Shot  Completed   DEXA scan (bone density measurement)  Completed   Zoster (Shingles) Vaccine  Completed   HPV Vaccine  Aged Out    Advanced directives: (In Chart) A copy of your advanced directives are scanned into your chart should your provider ever need it.  Next Medicare Annual Wellness Visit scheduled for next year: Yes 09/07/24 @ 10:10

## 2023-09-05 NOTE — Progress Notes (Signed)
 Subjective:   Sheila Osborne is a 88 y.o. who presents for a Medicare Wellness preventive visit.  Visit Complete: Virtual I connected with  Sheila Osborne on 09/05/23 by a audio enabled telemedicine application and verified that I am speaking with the correct person using two identifiers.  Patient Location: Home  Provider Location: Office/Clinic  I discussed the limitations of evaluation and management by telemedicine. The patient expressed understanding and agreed to proceed.  Vital Signs: Because this visit was a virtual/telehealth visit, some criteria may be missing or patient reported. Any vitals not documented were not able to be obtained and vitals that have been documented are patient reported.  VideoDeclined- This patient declined Librarian, academic. Therefore the visit was completed with audio only.  Persons Participating in Visit: Patient.  AWV Questionnaire: Yes: Patient Medicare AWV questionnaire was completed by the patient on 09/01/23; I have confirmed that all information answered by patient is correct and no changes since this date.  Cardiac Risk Factors include: advanced age (>39men, >15 women);hypertension     Objective:    Today's Vitals   09/05/23 1014  Weight: 134 lb (60.8 kg)  Height: 5\' 5"  (1.651 m)   Body mass index is 22.3 kg/m.     09/05/2023   10:24 AM 03/03/2023    8:56 AM 07/26/2022   12:41 PM 03/20/2022   11:22 AM 07/16/2021    1:28 PM 07/15/2021    7:56 AM 08/25/2016    8:17 AM  Advanced Directives  Does Patient Have a Medical Advance Directive? Yes Yes Yes No Yes Yes No  Type of Estate agent of [place];Living will Healthcare Power of [place];Living will Healthcare Power of [place];Living will  Healthcare Power of [place];Living will Healthcare Power of [place];Living will   Does patient want to make changes to medical advance directive? No - Patient declined  No -  Patient declined  No - Patient declined    Copy of Healthcare Power of Attorney in Chart? Yes - validated most recent copy scanned in chart (See row information)  Yes - validated most recent copy scanned in chart (See row information)  Yes - validated most recent copy scanned in chart (See row information)    Would patient like information on creating a medical advance directive?    No - Patient declined       Current Medications (verified) Outpatient Encounter Medications as of 09/05/2023  Medication Sig   ALPRAZolam (XANAX) 0.25 MG tablet Take 0.25 mg by mouth at bedtime as needed for anxiety. Pt states she is taking 1/2 tablet BID   amLODipine (NORVASC) 5 MG tablet Take 5 mg by mouth 2 (two) times daily.   AMOXICILLIN PO Take by mouth.   aspirin 325 MG tablet Take 325 mg by mouth daily.   FIBER PO Take 1 tablet by mouth daily.   Multiple Vitamin (MULTIVITAMIN) tablet Take 1 tablet by mouth daily.   nitroGLYCERIN (NITROSTAT) 0.4 MG SL tablet Place 0.4 mg under the tongue every 5 (five) minutes as needed.   Omega-3 Fatty Acids (FISH OIL) 600 MG CAPS Take 1 capsule by mouth daily.   vitamin C (ASCORBIC ACID) 500 MG tablet Take 500 mg by mouth daily.   furosemide (LASIX) 20 MG tablet Take 20 mg by mouth as needed. (Patient not taking: Reported on 09/05/2023)   No facility-administered encounter medications on file as of 09/05/2023.    Allergies (verified) Patient has no known allergies.   History: Past  Medical History:  Diagnosis Date   Allergy    Anxiety    Aortic stenosis    Arthritis    Bright red blood per rectum    Carotid bruit    Dyspnea    Head injury 12/17/2020   Heart murmur    Hyperlipidemia    Hypertension    Syncope 2012   Past Surgical History:  Procedure Laterality Date   AORTIC VALVE REPLACEMENT  04/26/2016   BREAST BIOPSY Left 1997   core- neg   CARDIAC CATHETERIZATION Bilateral 02/13/2016   Procedure: Right/Left Heart Cath and Coronary Angiography;  Surgeon:  Dalia Heading, MD;  Location: [facility] INVASIVE CV [facility];  Service: Cardiovascular;  Laterality: Bilateral;   CARDIAC VALVE REPLACEMENT     CATARACT EXTRACTION  2014   x2   COLONOSCOPY  2006   COLONOSCOPY WITH PROPOFOL N/A 08/25/2016   Procedure: COLONOSCOPY WITH PROPOFOL;  Surgeon: Earline Mayotte, MD;  Location: [facility] ENDOSCOPY;  Service: Endoscopy;  Laterality: N/A;   COLONOSCOPY WITH PROPOFOL N/A 07/15/2021   Procedure: COLONOSCOPY WITH PROPOFOL;  Surgeon: Earline Mayotte, MD;  Location: [facility] ENDOSCOPY;  Service: Endoscopy;  Laterality: N/A;   Family History  Problem Relation Age of Onset   Hyperlipidemia Mother    Hypertension Mother    Stroke Mother    Cancer Father        stomach   Early death Father    Stroke Sister    Hypertension Sister    Stroke Sister    Hypertension Sister    Stroke Maternal Grandmother    Stroke Maternal Grandfather    Cancer Paternal Grandfather    Early death Paternal Grandfather    Breast cancer Neg Hx    Social History   Socioeconomic History   Marital status: Widowed    Spouse name: Not on file   Number of children: Not on file   Years of education: Not on file   Highest education level: Some college, no degree  Occupational History   Not on file  Tobacco Use   Smoking status: Never   Smokeless tobacco: Never  Substance and Sexual Activity   Alcohol use: Yes    Alcohol/week: 1.0 standard drink of alcohol    Types: 1 Glasses of wine per week    Comment: 1/day   Drug use: No   Sexual activity: Not Currently  Other Topics Concern   Not on file  Social History Narrative   Not on file   Social Drivers of Health   Financial Resource Strain: Low Risk  (09/01/2023)   Overall Financial Resource Strain (CARDIA)    Difficulty of Paying Living Expenses: Not hard at all  Food Insecurity: No Food Insecurity (09/01/2023)   Hunger Vital Sign    Worried About Running Out of Food in the Last Year: Never true    Ran Out of Food in the Last Year:  Never true  Transportation Needs: No Transportation Needs (09/01/2023)   PRAPARE - Administrator, Civil Service (Medical): No    Lack of Transportation (Non-Medical): No  Physical Activity: Sufficiently Active (09/01/2023)   Exercise Vital Sign    Days of Exercise per Week: 6 days    Minutes of Exercise per Session: 60 min  Stress: No Stress Concern Present (09/01/2023)   [employer] of Occupational Health - Occupational Stress Questionnaire    Feeling of Stress : Only a little  Social Connections: Moderately Isolated (09/01/2023)   Social Connection and Isolation Panel [  NHANES]    Frequency of Communication with Friends and Family: More than three times a week    Frequency of Social Gatherings with Friends and Family: Once a week    Attends Religious Services: Never    Database administrator or Organizations: Yes    Attends Engineer, structural: More than 4 times per year    Marital Status: Widowed    Tobacco Counseling Counseling given: Not Answered    Clinical Intake:  Pre-visit preparation completed: Yes  Pain : No/denies pain     BMI - recorded: 22.3 Nutritional Status: BMI of 19-24  Normal Nutritional Risks: None Diabetes: No  Lab Results  Component Value Date   HGBA1C 5.4 01/19/2023     How often do you need to have someone help you when you read instructions, pamphlets, or other written materials from your doctor or pharmacy?: 1 - Never  Interpreter Needed?: No  Information entered by :: R. Charlesia Canaday LPN   Activities of Daily Living     09/01/2023    9:03 AM 07/20/2023    3:25 PM  In your present state of health, do you have any difficulty performing the following activities:  Hearing? 1 0  Comment wears aids   Vision? 0 0  Comment glasses   Difficulty concentrating or making decisions? 0 0  Walking or climbing stairs? 0 0  Dressing or bathing? 0 0  Doing errands, shopping? 0 0  Preparing Food and eating ? N N  Using the  Toilet? N N  In the past six months, have you accidently leaked urine? N N  Do you have problems with loss of bowel control? N N  Managing your Medications? N N  Managing your Finances? N N  Housekeeping or managing your Housekeeping? N N    Patient Care Team: Sherlene Shams, MD as PCP - General (Internal Medicine) Lemar Livings Merrily Pew, MD (General Surgery) Alan Mulder, MD as Attending Physician (Endocrinology)  Indicate any recent Medical Services you may have received from other than [facility] providers in the past year (date may be approximate).     Assessment:   This is a routine wellness examination for Sheila Osborne.  Hearing/Vision screen Hearing Screening - Comments:: Wears aids Vision Screening - Comments:: glasses   Goals Addressed             This Visit's Progress    Patient Stated       Wants to stay fit, continue exercising and eat healthy Enjoy life       Depression Screen     09/05/2023   10:21 AM 03/22/2023   11:08 AM 01/19/2023   11:07 AM 12/20/2022    9:12 AM 07/26/2022   12:42 PM 12/17/2021    9:13 AM 07/16/2021    1:27 PM  PHQ 2/9 Scores  PHQ - 2 Score 0 0 0 0 0 0 0  PHQ- 9 Score 0          Fall Risk     09/01/2023    9:03 AM 07/20/2023    3:25 PM 03/22/2023   11:08 AM 03/22/2023   11:07 AM 01/19/2023   11:07 AM  Fall Risk   Falls in the past year? 1 1 1 1  0  Number falls in past yr: 0 0 0 0 0  Injury with Fall? 1 1 1 1  0  Risk for fall due to : History of fall(s);Mental status change  History of fall(s) History of fall(s) No  Fall Risks  Risk for fall due to: Comment fainted      Follow up Falls evaluation completed;Falls prevention discussed  Falls evaluation completed Falls evaluation completed Falls evaluation completed    MEDICARE RISK AT HOME:  Medicare Risk at Home Any stairs in or around the home?: (Patient-Rptd) Yes If so, are there any without handrails?: (Patient-Rptd) No Home free of loose throw rugs in walkways, pet beds,  electrical cords, etc?: (Patient-Rptd) Yes Adequate lighting in your home to reduce risk of falls?: (Patient-Rptd) Yes Life alert?: (Patient-Rptd) Yes Use of a cane, walker or w/c?: (Patient-Rptd) No Grab bars in the bathroom?: (Patient-Rptd) Yes Shower chair or bench in shower?: (Patient-Rptd) Yes Elevated toilet seat or a handicapped toilet?: (Patient-Rptd) No  TIMED UP AND GO:  Was the test performed?  No  Cognitive Function: 6CIT completed        09/05/2023   10:24 AM 07/26/2022   12:50 PM  6CIT Screen  What Year? 0 points 0 points  What month? 0 points 0 points  What time? 0 points 0 points  Count back from 20 0 points 0 points  Months in reverse 0 points 0 points  Repeat phrase 0 points 0 points  Total Score 0 points 0 points    Immunizations Immunization History  Administered Date(s) Administered   Fluad Quad(high Dose 65+) 05/21/2021   Fluad Trivalent(High Dose 65+) 03/22/2023   Influenza-Unspecified 03/27/2019, 03/20/2020   PFIZER(Purple Top)SARS-COV-2 Vaccination 06/21/2019, 07/12/2019, 03/17/2020, 10/13/2020   Pneumococcal Conjugate-13 07/17/2020   Tdap 03/03/2023   Zoster Recombinant(Shingrix) 07/28/2021, 10/25/2021    Screening Tests Health Maintenance  Topic Date Due   Pneumonia Vaccine 75+ Years old (2 of 2 - PPSV23 or PCV20) 09/11/2020   COVID-19 Vaccine (5 - 2024-25 season) 02/06/2023   MAMMOGRAM  12/28/2023   Medicare Annual Wellness (AWV)  09/04/2024   DTaP/Tdap/Td (2 - Td or Tdap) 03/02/2033   INFLUENZA VACCINE  Completed   DEXA SCAN  Completed   Zoster Vaccines- Shingrix  Completed   HPV VACCINES  Aged Out    Health Maintenance  Health Maintenance Due  Topic Date Due   Pneumonia Vaccine 7+ Years old (2 of 2 - PPSV23 or PCV20) 09/11/2020   COVID-19 Vaccine (5 - 2024-25 season) 02/06/2023   Health Maintenance Items Addressed: DEXA ordered Patient would like to have pneumonia vaccine at next office visit. Patient declines covid  vaccines  Additional Screening:  Vision Screening: Recommended annual ophthalmology exams for early detection of glaucoma and other disorders of the eye.Up to date [facility]  Dental Screening: Recommended annual dental exams for proper oral hygiene  Community Resource Referral / Chronic Care Management: CRR required this visit?  No   CCM required this visit?  No     Plan:     I have personally reviewed and noted the following in the patient's chart:   Medical and social history Use of alcohol, tobacco or illicit drugs  Current medications and supplements including opioid prescriptions. Patient is not currently taking opioid prescriptions. Functional ability and status Nutritional status Physical activity Advanced directives List of other physicians Hospitalizations, surgeries, and ER visits in previous 12 months Vitals Screenings to include cognitive, depression, and falls Referrals and appointments  In addition, I have reviewed and discussed with patient certain preventive protocols, quality metrics, and best practice recommendations. A written personalized care plan for preventive services as well as general preventive health recommendations were provided to patient.     Sydell Axon, LPN  09/05/2023   After Visit Summary: (MyChart) Due to this being a telephonic visit, the after visit summary with patients personalized plan was offered to patient via MyChart   Notes: Nothing significant to report at this time.

## 2023-10-26 ENCOUNTER — Other Ambulatory Visit: Payer: Self-pay | Admitting: Nurse Practitioner

## 2023-10-26 DIAGNOSIS — I10 Essential (primary) hypertension: Secondary | ICD-10-CM

## 2023-10-26 DIAGNOSIS — R079 Chest pain, unspecified: Secondary | ICD-10-CM

## 2023-11-09 ENCOUNTER — Ambulatory Visit
Admission: RE | Admit: 2023-11-09 | Discharge: 2023-11-09 | Disposition: A | Discharge status: Home / Self Care | Payer: Self-pay | Source: Ambulatory Visit | Attending: Nurse Practitioner | Admitting: Nurse Practitioner

## 2023-11-09 DIAGNOSIS — R079 Chest pain, unspecified: Secondary | ICD-10-CM | POA: Insufficient documentation

## 2023-11-09 DIAGNOSIS — I10 Essential (primary) hypertension: Secondary | ICD-10-CM | POA: Insufficient documentation

## 2023-12-26 ENCOUNTER — Other Ambulatory Visit: Payer: Self-pay | Admitting: Internal Medicine

## 2023-12-26 DIAGNOSIS — Z1231 Encounter for screening mammogram for malignant neoplasm of breast: Secondary | ICD-10-CM

## 2024-01-19 ENCOUNTER — Ambulatory Visit: Payer: Medicare Other | Admitting: Internal Medicine

## 2024-01-19 ENCOUNTER — Ambulatory Visit: Payer: Self-pay | Admitting: Internal Medicine

## 2024-01-19 ENCOUNTER — Encounter: Payer: Self-pay | Admitting: Internal Medicine

## 2024-01-19 VITALS — BP 122/78 | HR 61 | Ht 65.0 in | Wt 136.6 lb

## 2024-01-19 DIAGNOSIS — Z23 Encounter for immunization: Secondary | ICD-10-CM | POA: Diagnosis not present

## 2024-01-19 DIAGNOSIS — I2510 Atherosclerotic heart disease of native coronary artery without angina pectoris: Secondary | ICD-10-CM

## 2024-01-19 DIAGNOSIS — Z8673 Personal history of transient ischemic attack (TIA), and cerebral infarction without residual deficits: Secondary | ICD-10-CM

## 2024-01-19 DIAGNOSIS — R5383 Other fatigue: Secondary | ICD-10-CM

## 2024-01-19 DIAGNOSIS — I4891 Unspecified atrial fibrillation: Secondary | ICD-10-CM

## 2024-01-19 DIAGNOSIS — I2584 Coronary atherosclerosis due to calcified coronary lesion: Secondary | ICD-10-CM

## 2024-01-19 DIAGNOSIS — I359 Nonrheumatic aortic valve disorder, unspecified: Secondary | ICD-10-CM

## 2024-01-19 DIAGNOSIS — Z780 Asymptomatic menopausal state: Secondary | ICD-10-CM

## 2024-01-19 DIAGNOSIS — I700 Atherosclerosis of aorta: Secondary | ICD-10-CM

## 2024-01-19 DIAGNOSIS — I9789 Other postprocedural complications and disorders of the circulatory system, not elsewhere classified: Secondary | ICD-10-CM

## 2024-01-19 DIAGNOSIS — R7301 Impaired fasting glucose: Secondary | ICD-10-CM

## 2024-01-19 DIAGNOSIS — M8580 Other specified disorders of bone density and structure, unspecified site: Secondary | ICD-10-CM

## 2024-01-19 DIAGNOSIS — I10 Essential (primary) hypertension: Secondary | ICD-10-CM

## 2024-01-19 LAB — CBC WITH DIFFERENTIAL/PLATELET
Basophils Absolute: 0 K/uL (ref 0.0–0.1)
Basophils Relative: 0.7 % (ref 0.0–3.0)
Eosinophils Absolute: 0 K/uL (ref 0.0–0.7)
Eosinophils Relative: 0.8 % (ref 0.0–5.0)
HCT: 40.7 % (ref 36.0–46.0)
Hemoglobin: 13.5 g/dL (ref 12.0–15.0)
Lymphocytes Relative: 20.2 % (ref 12.0–46.0)
Lymphs Abs: 0.9 K/uL (ref 0.7–4.0)
MCHC: 33.1 g/dL (ref 30.0–36.0)
MCV: 93.4 fl (ref 78.0–100.0)
Monocytes Absolute: 0.4 K/uL (ref 0.1–1.0)
Monocytes Relative: 9.4 % (ref 3.0–12.0)
Neutro Abs: 3.1 K/uL (ref 1.4–7.7)
Neutrophils Relative %: 68.9 % (ref 43.0–77.0)
Platelets: 261 K/uL (ref 150.0–400.0)
RBC: 4.36 Mil/uL (ref 3.87–5.11)
RDW: 12.6 % (ref 11.5–15.5)
WBC: 4.5 K/uL (ref 4.0–10.5)

## 2024-01-19 LAB — COMPREHENSIVE METABOLIC PANEL WITH GFR
ALT: 11 U/L (ref 0–35)
AST: 17 U/L (ref 0–37)
Albumin: 4.3 g/dL (ref 3.5–5.2)
Alkaline Phosphatase: 50 U/L (ref 39–117)
BUN: 11 mg/dL (ref 6–23)
CO2: 31 meq/L (ref 19–32)
Calcium: 9.5 mg/dL (ref 8.4–10.5)
Chloride: 100 meq/L (ref 96–112)
Creatinine, Ser: 0.74 mg/dL (ref 0.40–1.20)
GFR: 73.62 mL/min (ref >60.00)
Glucose, Bld: 102 mg/dL — ABNORMAL HIGH (ref 70–99)
Potassium: 4 meq/L (ref 3.5–5.1)
Sodium: 137 meq/L (ref 135–145)
Total Bilirubin: 0.4 mg/dL (ref 0.2–1.2)
Total Protein: 6.9 g/dL (ref 6.0–8.3)

## 2024-01-19 LAB — TSH: TSH: 0.73 u[IU]/mL (ref 0.35–5.50)

## 2024-01-19 LAB — MICROALBUMIN / CREATININE URINE RATIO
Creatinine,U: 80.1 mg/dL
Microalb Creat Ratio: UNDETERMINED mg/g (ref 0.0–30.0)
Microalb, Ur: <0.7 mg/dL

## 2024-01-19 LAB — HEMOGLOBIN A1C: Hgb A1c MFr Bld: 5.6 % (ref 4.6–6.5)

## 2024-01-19 MED ORDER — LOSARTAN POTASSIUM 25 MG PO TABS: 25.0000 mg | ORAL_TABLET | Freq: Every day | ORAL | 0 refills | Status: DC

## 2024-01-19 NOTE — Assessment & Plan Note (Signed)
 She has been in sinus rhythm for years and has stopped  metoprolol given the effect on her mental health.

## 2024-01-19 NOTE — Progress Notes (Signed)
 Patient ID: Sheila Osborne, female    DOB: 09-25-1935  Age: 88 y.o. MRN: [id number]  The patient is here for FOLLOW UP AND  management of other chronic and acute problems.   The risk factors are reflected in the social history.   The roster of all physicians providing medical care to patient - is listed in the Snapshot section of the chart.   Activities of daily living:  The patient is 100% independent in all ADLs: dressing, toileting, feeding as well as independent mobility   Home safety : The patient has smoke detectors in the home. They wear seatbelts.  There are no unsecured firearms at home. There is no violence in the home.    There is no risks for hepatitis, STDs or HIV. There is no   history of blood transfusion. They have no travel history to infectious disease endemic areas of the world.   The patient has seen their dentist in the last six month. They have seen their eye doctor in the last year. The patinet  denies slight hearing difficulty with regard to whispered voices and some television programs.  They have deferred audiologic testing in the last year.  They do not  have excessive sun exposure. Discussed the need for sun protection: hats, long sleeves and use of sunscreen if there is significant sun exposure.    Diet: the importance of a healthy diet is discussed. They do have a healthy diet.   The benefits of regular aerobic exercise were discussed. The patient  exercises  3 to 5 days per week  for  60 minutes.    Depression screen: there are no signs or vegative symptoms of depression- irritability, change in appetite, anhedonia, sadness/tearfullness.   The following portions of the patient's history were reviewed and updated as appropriate: allergies, current medications, past family history, past medical history,  past surgical history, past social history  and problem list.   Visual acuity was not assessed per patient preference since the patient has regular  follow up with an  ophthalmologist. Hearing and body mass index were assessed and reviewed.    During the course of the visit the patient was educated and counseled about appropriate screening and preventive services including : fall prevention , diabetes screening, nutrition counseling, colorectal cancer screening, and recommended immunizations.    Chief Complaint:  Aortic aneurysm   reviewed recent CTA /ECHO by [facility];   no significant change in size over the past year   Hypertension: patient checks blood pressure twice weekly at home.  Readings have been for the most part <130/80 at rest . Patient is following a reduced salt diet most days and is taking medications as prescribed   Review of Symptoms  Patient denies headache, fevers, malaise, unintentional weight loss, skin rash, eye pain, sinus congestion and sinus pain, sore throat, dysphagia,  hemoptysis , cough, dyspnea, wheezing, chest pain, palpitations, orthopnea, edema, abdominal pain, nausea, melena, diarrhea, constipation, flank pain, dysuria, hematuria, urinary  Frequency, nocturia, numbness, tingling, seizures,  Focal weakness, Loss of consciousness,  Tremor, insomnia, depression, anxiety, and suicidal ideation.    Physical Exam:  BP 122/78 (Cuff Size: Normal)   Pulse 61   Ht 5' 5 (1.651 m)   Wt 136 lb 9.6 oz (62 kg)   SpO2 99%   BMI 22.73 kg/m    Physical Exam Vitals reviewed.  Constitutional:      General: She is not in acute distress.    Appearance: Normal appearance.  She is well-developed and normal weight. She is not ill-appearing, toxic-appearing or diaphoretic.  HENT:     Head: Normocephalic.     Right Ear: Tympanic membrane, ear canal and external ear normal. There is no impacted cerumen.     Left Ear: Tympanic membrane, ear canal and external ear normal. There is no impacted cerumen.     Nose: Nose normal.     Mouth/Throat:     Mouth: Mucous membranes are moist.     Pharynx: Oropharynx  is clear.  Eyes:     General: No scleral icterus.       Right eye: No discharge.        Left eye: No discharge.     Conjunctiva/sclera: Conjunctivae normal.     Pupils: Pupils are equal, round, and reactive to light.  Neck:     Thyroid : No thyromegaly.     Vascular: No carotid bruit or JVD.  Cardiovascular:     Rate and Rhythm: Normal rate and regular rhythm.     Heart sounds: Normal heart sounds.  Pulmonary:     Effort: Pulmonary effort is normal. No respiratory distress.     Breath sounds: Normal breath sounds.  Chest:  Breasts:    Breasts are symmetrical.     Right: Normal. No swelling, inverted nipple, mass, nipple discharge, skin change or tenderness.     Left: Normal. No swelling, inverted nipple, mass, nipple discharge, skin change or tenderness.  Abdominal:     General: Bowel sounds are normal.     Palpations: Abdomen is soft. There is no mass.     Tenderness: There is no abdominal tenderness. There is no guarding or rebound.  Musculoskeletal:        General: Normal range of motion.     Cervical back: Normal range of motion and neck supple.  Lymphadenopathy:     Cervical: No cervical adenopathy.     Upper Body:     Right upper body: No supraclavicular, axillary or pectoral adenopathy.     Left upper body: No supraclavicular, axillary or pectoral adenopathy.  Skin:    General: Skin is warm and dry.  Neurological:     General: No focal deficit present.     Mental Status: She is alert and oriented to person, place, and time. Mental status is at baseline.  Psychiatric:        Mood and Affect: Mood normal.        Behavior: Behavior normal.        Thought Content: Thought content normal.        Judgment: Judgment normal.     Assessment and Plan: Aortic atherosclerosis (HCC) Assessment & Plan: Untreated due to statin myalgias and headaches. She has a CAC score of 258 and will consider adding Zetia or Repatha    Hypertension, essential Assessment & Plan: Well  controlled on current regimen of amlodipine  5 mg bid. Metoprolol was stopped due to symptomatic bradycardia with 3.5 sec pause. Will add  ARB to achieve bp 120/70.  Most recent 126/78 last week Renal function is normal  Lab Results  Component Value Date   CREATININE 0.74 01/19/2024   Lab Results  Component Value Date   NA 137 01/19/2024   K 4.0 01/19/2024   CL 100 01/19/2024   CO2 31 01/19/2024   Lab Results  Component Value Date   MICROALBUR <0.7 01/19/2024       Orders: -     Microalbumin / creatinine urine ratio -  Basic metabolic panel with GFR; Future  Impaired fasting glucose -     Comprehensive metabolic panel with GFR -     Hemoglobin A1c  Other fatigue -     TSH -     CBC with Differential/Platelet  Aortic valve disease  Coronary artery disease due to calcified coronary lesion Assessment & Plan: She has a history of statin myalgia but is willing to consider Zetia or Repatha for therapy if she is not able to reudce her LDL to < 70 on natural bergamot supplements   Postoperative atrial fibrillation (HCC) Assessment & Plan: She has been in sinus rhythm for years and has stopped  metoprolol given the effect on her mental health.     History of CVA (cerebrovascular accident) without residual deficits Assessment & Plan: Recent MRI notes a right sided lacunar infarct which was not noted on 2016 MRI.  Reviewed  Blood pressure readings,  Lipid panel. She is statin intolerant    Osteopenia after menopause Assessment & Plan: Unchanged by  2023 DEXA . Will repeat in 2028   Need for pneumococcal 20-valent conjugate vaccination -     Pneumococcal conjugate vaccine 20-valent  Other orders -     Losartan  Potassium; Take 1 tablet (25 mg total) by mouth daily.  Dispense: 90 tablet; Refill: 0  I personally spent a total of 35 minutes in the care of the patient today including getting/reviewing separately obtained history, performing a medically appropriate  exam/evaluation, counseling and educating, placing orders, documenting clinical information in the EHR, and independently interpreting results.   No follow-ups on file.  Verneita LITTIE Kettering, MD

## 2024-01-19 NOTE — Assessment & Plan Note (Signed)
 She has a history of statin myalgia but is willing to consider Zetia or Repatha for therapy if she is not able to reudce her LDL to < 70 on natural bergamot supplements

## 2024-01-19 NOTE — Patient Instructions (Signed)
 We want to keep your blood pressure at or below 120/70 MOST OF THE TIME  PLEASE CONTINUE TO MONITOR AT HOME  AND SEND ME READINGS PERIODICALLY  YOU  CAN RETURN FOR FASTING LIPIDS AFTER 2 MONTHS OF CITRUS BERGAMOT SUPPLEMENT   ZETIA AND REPATHA ARE BOTH OPTIONS FOR YOU IF YOUR LDL IS NOT < 70

## 2024-01-19 NOTE — Assessment & Plan Note (Signed)
 Well controlled on current regimen of amlodipine  5 mg bid. Metoprolol was stopped due to symptomatic bradycardia with 3.5 sec pause. Will add  ARB to achieve bp 120/70.  Most recent 126/78 last week Renal function is normal  Lab Results  Component Value Date   CREATININE 0.74 01/19/2024   Lab Results  Component Value Date   NA 137 01/19/2024   K 4.0 01/19/2024   CL 100 01/19/2024   CO2 31 01/19/2024   Lab Results  Component Value Date   MICROALBUR <0.7 01/19/2024

## 2024-01-19 NOTE — Assessment & Plan Note (Signed)
 Untreated due to statin myalgias and headaches. She has a CAC score of 258 and will consider adding Zetia or Repatha

## 2024-01-19 NOTE — Assessment & Plan Note (Signed)
 Recent MRI notes a right sided lacunar infarct which was not noted on 2016 MRI.  Reviewed  Blood pressure readings,  Lipid panel. She is statin intolerant

## 2024-01-19 NOTE — Assessment & Plan Note (Signed)
 Unchanged by  2023 DEXA . Will repeat in 2028

## 2024-01-23 ENCOUNTER — Telehealth: Payer: Self-pay | Admitting: Internal Medicine

## 2024-01-23 NOTE — Telephone Encounter (Signed)
Need lab order

## 2024-01-24 ENCOUNTER — Ambulatory Visit
Admission: RE | Admit: 2024-01-24 | Discharge: 2024-01-24 | Disposition: A | Discharge status: Home / Self Care | Source: Ambulatory Visit | Attending: Internal Medicine | Admitting: Internal Medicine

## 2024-01-24 DIAGNOSIS — Z780 Asymptomatic menopausal state: Secondary | ICD-10-CM

## 2024-01-24 DIAGNOSIS — Z1231 Encounter for screening mammogram for malignant neoplasm of breast: Secondary | ICD-10-CM | POA: Diagnosis present

## 2024-01-26 ENCOUNTER — Ambulatory Visit: Payer: Self-pay | Admitting: Internal Medicine

## 2024-02-03 ENCOUNTER — Other Ambulatory Visit (INDEPENDENT_AMBULATORY_CARE_PROVIDER_SITE_OTHER)

## 2024-02-03 ENCOUNTER — Telehealth: Payer: Self-pay | Admitting: Internal Medicine

## 2024-02-03 DIAGNOSIS — E782 Mixed hyperlipidemia: Secondary | ICD-10-CM

## 2024-02-03 DIAGNOSIS — I10 Essential (primary) hypertension: Secondary | ICD-10-CM | POA: Diagnosis not present

## 2024-02-03 LAB — BASIC METABOLIC PANEL WITH GFR
BUN: 14 mg/dL (ref 6–23)
CO2: 29 meq/L (ref 19–32)
Calcium: 9.4 mg/dL (ref 8.4–10.5)
Chloride: 101 meq/L (ref 96–112)
Creatinine, Ser: 0.75 mg/dL (ref 0.40–1.20)
GFR: 72.43 mL/min (ref >60.00)
Glucose, Bld: 100 mg/dL — ABNORMAL HIGH (ref 70–99)
Potassium: 4.1 meq/L (ref 3.5–5.1)
Sodium: 140 meq/L (ref 135–145)

## 2024-02-03 NOTE — Telephone Encounter (Addendum)
 Pt dropped off list of BP readings for Dr Marylynn to read. Pt also states she and Dr Marylynn spoke about having her cholesterol re-tested after 9/26, not seeing future orders in her chart. Please reach out to pt. BP list is in Dr Lula color folder up front -[facility]

## 2024-02-03 NOTE — Telephone Encounter (Signed)
Placed in yellow folder for review 

## 2024-02-07 NOTE — Addendum Note (Signed)
 Addended by: MARYLYNN VERNEITA CROME on: 02/07/2024 08:32 PM   Modules accepted: Orders

## 2024-02-23 ENCOUNTER — Ambulatory Visit: Payer: Self-pay

## 2024-02-23 NOTE — Telephone Encounter (Signed)
 Note: pt has hx of uncorrected ascending aortic aneurysm ----------------------------------------------  Copied from CRM #[id number]. Topic: Clinical - Medical Advice >> Feb 23, 2024  3:22 PM Larissa S wrote: Reason for CRM: Patient states she has been very tired for a week and would like a callback from PCP/nurse to discuss concerns.

## 2024-02-23 NOTE — Telephone Encounter (Signed)
 FYI Only or Action Required?: FYI only for provider.  Patient was last seen in primary care on 01/19/2024 by Marylynn Verneita CROME, MD.  Called Nurse Triage reporting Fatigue.  Symptoms began a week ago.  Interventions attempted: Rest, hydration, or home remedies.  Symptoms are: unchanged.  Triage Disposition: See PCP When Office is Open (Within 3 Days)  Patient/caregiver understands and will follow disposition?: Yes  **Appt. Scheduled for 9/24**    Reason for Disposition  [1] Fatigue (i.e., tires easily, decreased energy) AND [2] persists > 1 week  Answer Assessment - Initial Assessment Questions 1. DESCRIPTION: Describe how you are feeling.     She stated she is always tired, worse in the mornings   2. SEVERITY: How bad is it?  Can you stand and walk? Mild-Moderate      3. ONSET: When did these symptoms begin? (e.g., hours, days, weeks, months)     X 1 week  4. CAUSE: What do you think is causing the weakness or fatigue? (e.g., not drinking enough fluids, medical problem, trouble sleeping)      Unknown, but suspects Losartan   5. NEW MEDICINES:  Have you started on any new medicines recently? (e.g., opioid pain medicines, benzodiazepines, muscle relaxants, antidepressants, antihistamines, neuroleptics, beta blockers)    No    6. OTHER SYMPTOMS: Do you have any other symptoms? (e.g., chest pain, fever, cough, SOB, vomiting, diarrhea, bleeding, other areas of pain)  No.   Appt. Scheduled for 9/24 for follow-up.  Protocols used: Weakness (Generalized) and Fatigue-A-AH

## 2024-02-29 ENCOUNTER — Ambulatory Visit: Admitting: Internal Medicine

## 2024-03-05 ENCOUNTER — Other Ambulatory Visit (INDEPENDENT_AMBULATORY_CARE_PROVIDER_SITE_OTHER)

## 2024-03-05 DIAGNOSIS — E782 Mixed hyperlipidemia: Secondary | ICD-10-CM | POA: Diagnosis not present

## 2024-03-05 LAB — COMPREHENSIVE METABOLIC PANEL WITH GFR
ALT: 12 U/L (ref 0–35)
AST: 17 U/L (ref 0–37)
Albumin: 4.6 g/dL (ref 3.5–5.2)
Alkaline Phosphatase: 50 U/L (ref 39–117)
BUN: 10 mg/dL (ref 6–23)
CO2: 30 meq/L (ref 19–32)
Calcium: 9.6 mg/dL (ref 8.4–10.5)
Chloride: 101 meq/L (ref 96–112)
Creatinine, Ser: 0.7 mg/dL (ref 0.40–1.20)
GFR: 78.63 mL/min (ref >60.00)
Glucose, Bld: 97 mg/dL (ref 70–99)
Potassium: 4 meq/L (ref 3.5–5.1)
Sodium: 139 meq/L (ref 135–145)
Total Bilirubin: 0.6 mg/dL (ref 0.2–1.2)
Total Protein: 7.3 g/dL (ref 6.0–8.3)

## 2024-03-05 LAB — LIPID PANEL
Cholesterol: 272 mg/dL — ABNORMAL HIGH (ref 0–200)
HDL: 106 mg/dL (ref >39.00)
LDL Cholesterol: 150 mg/dL — ABNORMAL HIGH (ref 0–99)
NonHDL: 165.85
Total CHOL/HDL Ratio: 3
Triglycerides: 79 mg/dL (ref 0.0–149.0)
VLDL: 15.8 mg/dL (ref 0.0–40.0)

## 2024-03-05 LAB — LDL CHOLESTEROL, DIRECT: Direct LDL: 141 mg/dL

## 2024-03-06 ENCOUNTER — Ambulatory Visit: Payer: Self-pay | Admitting: Internal Medicine

## 2024-03-06 DIAGNOSIS — E782 Mixed hyperlipidemia: Secondary | ICD-10-CM

## 2024-03-08 ENCOUNTER — Other Ambulatory Visit: Payer: Self-pay

## 2024-03-08 MED ORDER — EZETIMIBE 10 MG PO TABS: 10.0000 mg | ORAL_TABLET | Freq: Every day | ORAL | 3 refills | Status: AC

## 2024-03-08 NOTE — Addendum Note (Signed)
 Addended by: MARYLYNN VERNEITA CROME on: 03/08/2024 02:10 PM   Modules accepted: Orders

## 2024-04-09 ENCOUNTER — Telehealth: Payer: Self-pay

## 2024-04-09 ENCOUNTER — Other Ambulatory Visit: Payer: Self-pay

## 2024-04-09 MED ORDER — LOSARTAN POTASSIUM 25 MG PO TABS: 25.0000 mg | ORAL_TABLET | Freq: Every day | ORAL | 1 refills | Status: AC

## 2024-04-09 MED ORDER — LOSARTAN POTASSIUM 25 MG PO TABS: 25.0000 mg | ORAL_TABLET | Freq: Every day | ORAL | 1 refills | Status: DC

## 2024-04-09 NOTE — Telephone Encounter (Signed)
 Spoke with pt to let her know that she is to continue taking the losartan . Pt gave a verbal understanding.

## 2024-04-09 NOTE — Addendum Note (Signed)
 Addended by: Margarite Vessel on: 04/09/2024 04:45 PM   Modules accepted: Orders

## 2024-04-09 NOTE — Telephone Encounter (Signed)
 Copied from CRM [id number]. Topic: Clinical - Medication Question >> Apr 09, 2024  2:19 PM Tysheama G wrote: Reason for CRM: Patient wanted to know if she should continue taking the medication losartan  (COZAAR ) 25 MG tablet , because she is all out and on the bottle it states no refills and she is all out. Callback number [phone]

## 2024-05-07 ENCOUNTER — Other Ambulatory Visit

## 2024-05-07 DIAGNOSIS — E782 Mixed hyperlipidemia: Secondary | ICD-10-CM

## 2024-05-07 LAB — COMPREHENSIVE METABOLIC PANEL WITH GFR
ALT: 14 U/L (ref 0–35)
AST: 18 U/L (ref 0–37)
Albumin: 4.4 g/dL (ref 3.5–5.2)
Alkaline Phosphatase: 46 U/L (ref 39–117)
BUN: 11 mg/dL (ref 6–23)
CO2: 31 meq/L (ref 19–32)
Calcium: 9.6 mg/dL (ref 8.4–10.5)
Chloride: 101 meq/L (ref 96–112)
Creatinine, Ser: 0.82 mg/dL (ref 0.40–1.20)
GFR: 64.95 mL/min (ref >60.00)
Glucose, Bld: 106 mg/dL — ABNORMAL HIGH (ref 70–99)
Potassium: 4.1 meq/L (ref 3.5–5.1)
Sodium: 141 meq/L (ref 135–145)
Total Bilirubin: 0.5 mg/dL (ref 0.2–1.2)
Total Protein: 7 g/dL (ref 6.0–8.3)

## 2024-05-07 LAB — LIPID PANEL
Cholesterol: 226 mg/dL — ABNORMAL HIGH (ref 0–200)
HDL: 94.2 mg/dL (ref >39.00)
LDL Cholesterol: 115 mg/dL — ABNORMAL HIGH (ref 0–99)
NonHDL: 131.93
Total CHOL/HDL Ratio: 2
Triglycerides: 87 mg/dL (ref 0.0–149.0)
VLDL: 17.4 mg/dL (ref 0.0–40.0)

## 2024-05-07 LAB — LDL CHOLESTEROL, DIRECT: Direct LDL: 101 mg/dL

## 2024-05-08 ENCOUNTER — Ambulatory Visit: Payer: Self-pay | Admitting: Internal Medicine

## 2024-09-07 ENCOUNTER — Ambulatory Visit

## 2024-09-14 ENCOUNTER — Encounter
